# Patient Record
Sex: Male | Born: 1970 | Hispanic: Yes | Marital: Married | Smoking: Current every day smoker
Health system: Southern US, Community
[De-identification: ages and names within clinical notes are randomized; demographics above are authoritative.]

## PROBLEM LIST (undated history)

## (undated) DIAGNOSIS — K2970 Gastritis, unspecified, without bleeding: Secondary | ICD-10-CM

## (undated) DIAGNOSIS — F32A Depression, unspecified: Secondary | ICD-10-CM

## (undated) DIAGNOSIS — B9681 Helicobacter pylori [H. pylori] as the cause of diseases classified elsewhere: Secondary | ICD-10-CM

## (undated) DIAGNOSIS — R443 Hallucinations, unspecified: Secondary | ICD-10-CM

## (undated) DIAGNOSIS — I10 Essential (primary) hypertension: Secondary | ICD-10-CM

## (undated) DIAGNOSIS — F329 Major depressive disorder, single episode, unspecified: Secondary | ICD-10-CM

---

## 2013-09-13 ENCOUNTER — Emergency Department: Payer: Self-pay | Admitting: Emergency Medicine

## 2013-09-13 LAB — COMPREHENSIVE METABOLIC PANEL
Albumin: 3.8 g/dL (ref 3.4–5.0)
Anion Gap: 2 — ABNORMAL LOW (ref 7–16)
Bilirubin,Total: 0.5 mg/dL (ref 0.2–1.0)
Calcium, Total: 9 mg/dL (ref 8.5–10.1)
Chloride: 112 mmol/L — ABNORMAL HIGH (ref 98–107)
Co2: 27 mmol/L (ref 21–32)
Creatinine: 0.9 mg/dL (ref 0.60–1.30)
EGFR (African American): >60
Osmolality: 280 (ref 275–301)
SGOT(AST): 32 U/L (ref 15–37)
SGPT (ALT): 35 U/L (ref 12–78)
Total Protein: 7.2 g/dL (ref 6.4–8.2)

## 2013-09-13 LAB — CBC WITH DIFFERENTIAL/PLATELET
Basophil #: 0.1 x10 3/mm 3 (ref 0.0–0.1)
Basophil %: 0.9 %
Eosinophil #: 0.2 x10 3/mm 3 (ref 0.0–0.7)
Eosinophil %: 2.1 %
HCT: 46.3 % (ref 40.0–52.0)
HGB: 15.2 g/dL (ref 13.0–18.0)
Lymphocyte %: 24 %
Lymphs Abs: 1.8 x10 3/mm 3 (ref 1.0–3.6)
MCH: 30.6 pg (ref 26.0–34.0)
MCHC: 32.9 g/dL (ref 32.0–36.0)
MCV: 93 fL (ref 80–100)
Monocyte #: 0.5 x10 3/mm (ref 0.2–1.0)
Monocyte %: 6.8 %
Neutrophil #: 5 x10 3/mm 3 (ref 1.4–6.5)
Neutrophil %: 66.2 %
Platelet: 210 x10 3/mm 3 (ref 150–440)
RBC: 4.98 x10 6/mm 3 (ref 4.40–5.90)
RDW: 14.3 % (ref 11.5–14.5)
WBC: 7.5 x10 3/mm 3 (ref 3.8–10.6)

## 2013-09-13 LAB — URINALYSIS, COMPLETE
Bacteria: NONE SEEN
Glucose,UR: NEGATIVE mg/dL (ref 0–75)
Ketone: NEGATIVE
Leukocyte Esterase: NEGATIVE
Nitrite: NEGATIVE
Ph: 5 (ref 4.5–8.0)
Protein: NEGATIVE
RBC,UR: 5 /HPF (ref 0–5)
WBC UR: <1 /HPF (ref 0–5)

## 2015-05-11 ENCOUNTER — Emergency Department (HOSPITAL_BASED_OUTPATIENT_CLINIC_OR_DEPARTMENT_OTHER): Payer: Self-pay

## 2015-05-11 ENCOUNTER — Emergency Department (HOSPITAL_BASED_OUTPATIENT_CLINIC_OR_DEPARTMENT_OTHER)
Admission: EM | Admit: 2015-05-11 | Discharge: 2015-05-11 | Disposition: A | Discharge status: Home / Self Care | Payer: Self-pay | Attending: Emergency Medicine | Admitting: Emergency Medicine

## 2015-05-11 ENCOUNTER — Encounter (HOSPITAL_BASED_OUTPATIENT_CLINIC_OR_DEPARTMENT_OTHER): Payer: Self-pay

## 2015-05-11 DIAGNOSIS — E860 Dehydration: Secondary | ICD-10-CM | POA: Insufficient documentation

## 2015-05-11 DIAGNOSIS — R42 Dizziness and giddiness: Secondary | ICD-10-CM | POA: Insufficient documentation

## 2015-05-11 DIAGNOSIS — Z720 Tobacco use: Secondary | ICD-10-CM | POA: Insufficient documentation

## 2015-05-11 DIAGNOSIS — R4020 Unspecified coma: Secondary | ICD-10-CM | POA: Insufficient documentation

## 2015-05-11 DIAGNOSIS — I10 Essential (primary) hypertension: Secondary | ICD-10-CM | POA: Insufficient documentation

## 2015-05-11 DIAGNOSIS — R4189 Other symptoms and signs involving cognitive functions and awareness: Secondary | ICD-10-CM

## 2015-05-11 DIAGNOSIS — Z8659 Personal history of other mental and behavioral disorders: Secondary | ICD-10-CM | POA: Insufficient documentation

## 2015-05-11 HISTORY — DX: Essential (primary) hypertension: I10

## 2015-05-11 HISTORY — DX: Major depressive disorder, single episode, unspecified: F32.9

## 2015-05-11 HISTORY — DX: Depression, unspecified: F32.A

## 2015-05-11 LAB — CBC WITH DIFFERENTIAL/PLATELET
BASOS ABS: 0 10*3/uL (ref 0.0–0.1)
Basophils Relative: 0 % (ref 0–1)
Eosinophils Absolute: 0.2 10*3/uL (ref 0.0–0.7)
Eosinophils Relative: 2 % (ref 0–5)
HEMATOCRIT: 46.5 % (ref 39.0–52.0)
HEMOGLOBIN: 15.8 g/dL (ref 13.0–17.0)
LYMPHS PCT: 24 % (ref 12–46)
Lymphs Abs: 2.1 10*3/uL (ref 0.7–4.0)
MCH: 30.6 pg (ref 26.0–34.0)
MCHC: 34 g/dL (ref 30.0–36.0)
MCV: 90.1 fL (ref 78.0–100.0)
MONO ABS: 0.8 10*3/uL (ref 0.1–1.0)
MONOS PCT: 9 % (ref 3–12)
NEUTROS ABS: 5.6 10*3/uL (ref 1.7–7.7)
Neutrophils Relative %: 65 % (ref 43–77)
Platelets: 233 10*3/uL (ref 150–400)
RBC: 5.16 MIL/uL (ref 4.22–5.81)
RDW: 13.9 % (ref 11.5–15.5)
WBC: 8.7 10*3/uL (ref 4.0–10.5)

## 2015-05-11 LAB — COMPREHENSIVE METABOLIC PANEL
ALK PHOS: 134 U/L — AB (ref 38–126)
ALT: 23 U/L (ref 17–63)
AST: 22 U/L (ref 15–41)
Albumin: 4.6 g/dL (ref 3.5–5.0)
Anion gap: 12 (ref 5–15)
BILIRUBIN TOTAL: 0.6 mg/dL (ref 0.3–1.2)
BUN: 28 mg/dL — AB (ref 6–20)
CALCIUM: 9.6 mg/dL (ref 8.9–10.3)
CO2: 25 mmol/L (ref 22–32)
Chloride: 100 mmol/L — ABNORMAL LOW (ref 101–111)
Creatinine, Ser: 2.23 mg/dL — ABNORMAL HIGH (ref 0.61–1.24)
GFR calc Af Amer: 40 mL/min — ABNORMAL LOW (ref >60)
GFR, EST NON AFRICAN AMERICAN: 34 mL/min — AB (ref >60)
Glucose, Bld: 101 mg/dL — ABNORMAL HIGH (ref 65–99)
POTASSIUM: 4.3 mmol/L (ref 3.5–5.1)
Sodium: 137 mmol/L (ref 135–145)
TOTAL PROTEIN: 7.3 g/dL (ref 6.5–8.1)

## 2015-05-11 LAB — TROPONIN I: Troponin I: <0.03 ng/mL (ref <0.031)

## 2015-05-11 LAB — CBG MONITORING, ED: GLUCOSE-CAPILLARY: 101 mg/dL — AB (ref 65–99)

## 2015-05-11 LAB — I-STAT CG4 LACTIC ACID, ED: Lactic Acid, Venous: 0.92 mmol/L (ref 0.5–2.0)

## 2015-05-11 LAB — CK: CK TOTAL: 253 U/L (ref 49–397)

## 2015-05-11 MED ORDER — SODIUM CHLORIDE 0.9 % IV BOLUS (SEPSIS)
2000.0000 mL | Freq: Once | INTRAVENOUS | Status: AC
  Administered 2015-05-11: 2000 mL via INTRAVENOUS

## 2015-05-11 MED ORDER — SODIUM CHLORIDE 0.9 % IV BOLUS (SEPSIS)
1000.0000 mL | Freq: Once | INTRAVENOUS | Status: AC
  Administered 2015-05-11: 1000 mL via INTRAVENOUS

## 2015-05-11 NOTE — ED Notes (Signed)
Patient transported to CT 

## 2015-05-11 NOTE — ED Notes (Signed)
Hung 1L NS before pt left for radiology

## 2015-05-11 NOTE — ED Notes (Signed)
MD at bedside. 

## 2015-05-11 NOTE — ED Provider Notes (Signed)
CSN: [id number]     Arrival date & time 05/11/15  1806 History  This chart was scribed for Mirian Mo, MD by Lyndel Safe, ED Scribe. This patient was seen in room MH10/MH10 and the patient's care was started 6:17 PM.   Chief Complaint  Patient presents with  . Unresponsive   Patient is a 44 y.o. male presenting with altered mental status. The history is provided by a relative and the spouse. The history is limited by the condition of the patient. No language interpreter was used.  Altered Mental Status Presenting symptoms: partial responsiveness   Severity:  Moderate Most recent episode:  Today Episode history:  Continuous Duration:  20 minutes Timing:  Constant Progression:  Partially resolved Chronicity:  New Context: not a nursing home resident, not a recent illness and not a recent infection   Associated symptoms: no fever     HPI Comments: Miguel Contreras is a 44 y.o. male, with a PMhx of HTN, who presents to the Emergency Department complaining of an episode of semi-unresponsiveness onset 20 minutes PTA. Wife states pt's condition is gradually improving. Per wife pt was working outside in the heat when he called her and stated he felt dizzy and was requesting her to pick him up from work. The pt's boss then called his wife and stated pt was semi-unresponsive laying on the grass. Wife states at the time she picked the pt up from work he was able to respond to her questions. Per nurse, pt was unresponsive to sternal rub on stretcher upon arrival. Pt's wife states pt has been working outside in the extreme heat and has been dehydrated. Wife states pt drinks approximately 4 Monster energy drinks per day and also consumes 5 hour energy shots. Wife denies fevers or recent illness. Pt denies current pain.  Past Medical History  Diagnosis Date  . Hypertension   . Depression    History reviewed. No pertinent past surgical history. No family history on file. Social History   Substance Use Topics  . Smoking status: Current Every Day Smoker  . Smokeless tobacco: None  . Alcohol Use: Yes    Review of Systems  Constitutional: Negative for fever.  Musculoskeletal: Negative for myalgias and arthralgias.  Neurological: Positive for dizziness.  All other systems reviewed and are negative.  Allergies  Review of patient's allergies indicates no known allergies.  Home Medications   Prior to Admission medications   Not on File   BP 152/103 mmHg  Pulse 73  Temp(Src) 97 F (36.1 C) (Oral)  Resp 10  SpO2 100% Physical Exam  Constitutional: He is oriented to person, place, and time. He appears well-developed and well-nourished.  HENT:  Head: Normocephalic and atraumatic.  Eyes: Conjunctivae and EOM are normal.  Neck: Normal range of motion. Neck supple.  Cardiovascular: Normal rate, regular rhythm and normal heart sounds.   Pulmonary/Chest: Effort normal and breath sounds normal. No respiratory distress.  Abdominal: He exhibits no distension. There is no tenderness. There is no rebound and no guarding.  Musculoskeletal: Normal range of motion.  Neurological: He is alert and oriented to person, place, and time.  Skin: Skin is warm and dry.  Vitals reviewed.   ED Course  Procedures  DIAGNOSTIC STUDIES: Oxygen Saturation is 100% on RA, normal by my interpretation.    COORDINATION OF CARE: 6:25 PM Discussed treatment plan which includes to order diagnostic labs and imaging with pt. Pt acknowledges and agrees to plan.   Labs Review Labs Reviewed  COMPREHENSIVE METABOLIC PANEL - Abnormal; Notable for the following:    Chloride 100 (*)    Glucose, Bld 101 (*)    BUN 28 (*)    Creatinine, Ser 2.23 (*)    Alkaline Phosphatase 134 (*)    GFR calc non Af Amer 34 (*)    GFR calc Af Amer 40 (*)    All other components within normal limits  CBG MONITORING, ED - Abnormal; Notable for the following:    Glucose-Capillary 101 (*)    All other components  within normal limits  CBC WITH DIFFERENTIAL/PLATELET  CK  TROPONIN I  URINALYSIS, ROUTINE W REFLEX MICROSCOPIC (NOT AT [facility])  URINE RAPID DRUG SCREEN, HOSP PERFORMED  I-STAT CG4 LACTIC ACID, ED    Imaging Review Dg Chest 2 View  05/11/2015   CLINICAL DATA:  Unresponsive after working outside all day.  EXAM: CHEST  2 VIEW  COMPARISON:  None.  FINDINGS: Lungs are adequately inflated without focal consolidation or effusion. Cardiomediastinal silhouette is within normal. There is minimal degenerative change of the spine.  IMPRESSION: No active cardiopulmonary disease.   Electronically Signed   By: Elberta Fortis M.D.   On: 05/11/2015 19:30   Ct Head Wo Contrast  05/11/2015   CLINICAL DATA:  Difficulty speaking  EXAM: CT HEAD WITHOUT CONTRAST  TECHNIQUE: Contiguous axial images were obtained from the base of the skull through the vertex without intravenous contrast.  COMPARISON:  None.  FINDINGS: No evidence of parenchymal hemorrhage or extra-axial fluid collection. No mass lesion, mass effect, or midline shift.  No CT evidence of acute infarction.  Cerebral volume is within normal limits.  No ventriculomegaly.  Partial opacification of the visualized ethmoid sinuses. The mastoid air cells are unopacified.  No evidence of calvarial fracture.  IMPRESSION: Normal head CT.   Electronically Signed   By: Charline Bills M.D.   On: 05/11/2015 19:38      EKG Interpretation   Date/Time:  Monday May 11 2015 18:16:44 EDT Ventricular Rate:  74 PR Interval:  134 QRS Duration: 96 QT Interval:  384 QTC Calculation: 426 R Axis:   92 Text Interpretation:  Normal sinus rhythm Rightward axis Moderate voltage  criteria for LVH, may be normal variant Borderline ECG No old tracing to  compare Confirmed by Mirian Mo [phone]) on 05/11/2015 6:28:30 PM      MDM   Final diagnoses:  Unresponsive  Dehydration    44 y.o. male with pertinent PMH of HTN, depression presents with altered mental status in  context of working outside in heat for the past 2 days and for water intake. On arrival today the patient was unresponsive for nursing staff, not responding to sternal rub.  On my examination the patient improved however had received approximately 250 mL of fluid.  Not tachycardic, subsequently obtained workup as above.  Workup obtained as above and with AKI.  Discussed this with the family and offered admission which the patient refused. I discussed that the patient's symptoms could be due to primary renal disease and they continued to refuse. Encourage recheck in 2-3 days either here or at an urgent care. The patient voiced understanding, agreed to follow-up. He is vastly improved after receiving approximately 3-1/2 L of fluid.    I have reviewed all laboratory and imaging studies if ordered as above  1. Unresponsive   2. Dehydration           Mirian Mo, MD 05/11/15 2025

## 2015-05-11 NOTE — Discharge Instructions (Signed)
Confusin (Confusion) La confusin es la incapacidad de pensar con la velocidad o la claridad habituales. La confusin puede aparecer rpidamente o puede manifestarse de a poco, con [employer]. La rapidez con la que aparezca depender de la causa. La confusin puede surgir por diversas causas. CAUSAS   Conmocin, lesin o traumatismo en la cabeza.  Convulsiones.  Ictus.  Cristy Hilts.  Tumores cerebrales.  Disminucin de la funcin cerebral relacionada con la edad (demencia).  Estados emocionales agudos, como furia o terror.  Enfermedad mental, en la que la persona pierde la capacidad de Teacher, adult education lo que es real y lo que no lo es (alucinaciones).  Infecciones, como la infeccin de las vas urinarias (IVU).  Efectos txicos del alcohol, las drogas o medicamentos prescriptos.  Deshidratacin y desequilibrio de Administrator cuerpo (electrolitos).  Falta de sueo.  Niveles bajos de azcar en la sangre (diabetes).  Niveles bajos de oxgeno por afecciones como las enfermedades pulmonares crnicas.  Interacciones [employer] u otros efectos secundarios de los medicamentos.  Deficiencias nutricionales, especialmente de niacina, tiamina, vitaminaC o vitaminaB.  Descenso repentino de Environmental education officer (hipotermia).  Cambio en la rutina, por ejemplo, durante un viaje o una hospitalizacin. SIGNOS Y SNTOMAS  A menudo, las personas declaran que, cuando estn confundidas, tienen pensamientos poco claros o inciertos. La confusin tambin puede incluir sentirse desorientado, lo que significa que uno no tiene conciencia de dnde est o de quin es. Tambin es posible no saber la fecha o la hora. Al estar confundido, tambin se puede tener dificultad para prestar atencin, recordar y tomar decisiones. Asimismo, algunas personas actan con agresividad cuando estn confundidas.  DIAGNSTICO  La evaluacin mdica de la confusin puede incluir lo siguiente:  Anlisis de [place] y  [place].  Radiografas.  Estudios del cerebro y el sistema nervioso.  Anlisis de las ondas cerebrales (electroencefalograma o EEG).  Imgenes por Health visitor (IRM) de la cabeza.  Tomografa computarizada (TC) de la cabeza.  Estudios del [employer], en los que el mdico puede hacer diversas preguntas. Algunas de estas preguntas pueden parecer tontas o extraas, pero son muy importantes para ayudar a diagnosticar y tratar la confusin. TRATAMIENTO  Quizs no sea necesaria la hospitalizacin, pero una persona con confusin no debe estar sola. Permanezca con un familiar o un amigo hasta que la confusin haya desaparecido. Evite el alcohol, los analgsicos y los sedantes hasta que se haya recuperado completamente. No conduzca hasta que el mdico lo autorice. INSTRUCCIONES PARA EL CUIDADO EN EL HOGAR  Lo que pueden hacer los familiares y amigos:  Para averiguar si alguien est confundido, pregntele el nombre, la edad y Soil scientist. Si la persona est insegura o responde de forma incorrecta, est confundida.  Presntese siempre, ms all de que la persona confundida lo conozca [employer].  Recurdele con frecuencia dnde est.  Coloque un calendario y un reloj cerca de la persona confundida.  Ayude a la persona con los medicamentos. Puede usar un pastillero o una alarma como recordatorio, o darle cada dosis como se la recetaron.  Hable sobre los acontecimientos actuales y los planes para Games developer.  Trate de que el entorno sea calmo, sin ruidos y sereno.  Asegrese de que la persona asista a las visitas de seguimiento con el mdico. PREVENCIN  Modos de evitar la confusin:  Evite el alcohol.  Consuma una dieta equilibrada.  Duerma lo suficiente.  Tome todos los medicamentos como le indic el mdico.  No se asle. Pase tiempo con  otras personas y haga planes para todos los Hilliard.  Si es diabtico, supervise con cuidado los niveles de Banker. SOLICITE ATENCIN  MDICA DE INMEDIATO SI:   Sufre dolores de cabeza intensos, vomita con frecuencia, tiene convulsiones, desvanecimientos o no puede hablar correctamente.  Aumenta la confusin, siente debilidad, adormecimiento, inquietud o modificaciones en la personalidad.  Tiene prdida de equilibrio o [employer], siente que no coordina o se Designer, industrial/product.  Tiene delirios, alucinaciones o ansiedad intensa.  Su familia considera que debe ser controlado nuevamente. Document Released: 09/12/2005 Document Revised: 01/27/2014 [facility] Patient Information 2015 [place], [place]. This information is not intended to replace advice given to you by your health care provider. Make sure you discuss any questions you have with your health care provider.  Deshidratacin, Adulto (Dehydration, Adult) Se denomina deshidratacin cuando se pierden ms lquidos de los que se ingieren. Los rganos [employer] riones, el cerebro y el corazn no pueden funcionar sin la [place] cantidad de lquidos y Airline pilot. Cualquier prdida de lquidos del organismo puede causar deshidratacin.  CAUSAS  Vmitos.  Diarrea.  Sudoracin excesiva.  Eliminacin excesiva de orina.  Grant Ruts. SNTOMAS Deshidratacin leve  Sed.  Labios resecos.  Sequedad leve de la mucosa bucal. Deshidratacin moderada   La boca est muy seca.  Ojos hundidos.  La piel no vuelve rpidamente a su lugar cuando se suelta luego de pellizcarla ligeramente.  Larose Kells y disminucin de la produccin de [place].  Disminucin de la cantidad de lgrimas.  Dolor de [place]. Deshidratacin grave  La boca est muy seca.  Sed extrema.  Pulso rpido y dbil (ms de 100 por minuto en reposo).  Manos y pies fros.  Prdida de la capacidad para transpirar, independientemente del calor y la temperatura.  Respiracin rpida.  Labios azulados.  Confusin y Health and safety inspector.  Dificultad para despertarse.  Poca produccin de [place].  Falta de  lgrimas. DIAGNSTICO El profesional diagnosticar deshidratacin basndose en los sntomas y el examen fsico. Las pruebas de sangre y [place] ayudarn a Astronomer el diagnstico. La evaluacin diagnstica suele identificar tambin las causas de la deshidratacin. TRATAMIENTO El tratamiento de la deshidratacin leve o moderada generalmente puede hacerse en el hogar mediante el aumento de la cantidad de los lquidos que se beben. Es mejor beber pequeas cantidades de lquidos con mayor frecuencia. Beber demasiado de una sola vez puede hacer que el vmito empeore. Siga las instrucciones para el cuidado en el hogar que se indican a continuacin.  La deshidratacin grave debe tratarse en el hospital en el que probablemente le administren lquidos por va intravenosa (IV) que contienen agua y Customer service manager.  INSTRUCCIONES PARA EL CUIDADO DOMICILIARIO  Pida instrucciones especficas a su mdico con respecto a la rehidratacin.  Debe ingerir gran cantidad de lquido para mantener la orina de tono claro o color amarillo plido.  Beba pequeas cantidades con frecuencia si tiene nuseas y vmitos.  Alimntese como lo hace normalmente.  Evite:  Alimentos o bebidas que Energy manager.  Gaseosas.  Jugos.  Lquidos muy calientes o fros.  Bebidas con cafena.  Alimentos muy grasos.  Alcohol.  Lowella Dell demasiado a la vez.  Postres de gelatina.  Lave bien sus manos para evitar las bacterias o los virus se diseminen.  Slo tome medicamentos de venta libre o recetados para Primary school teacher, las molestias o bajar la fiebre segn las indicaciones de su mdico.  Consulte a su mdico si puede seguir tomando todos sus medicamentos recetados o de venta  libre.  Chauncy Passy puntualmente a las citas de control con el mdico. SOLICITE ATENCIN MDICA SI:  Tiene dolor abdominal y este aumenta o permanece en un rea (se localiza).  Aparece una erupcin, rigidez en el cuello o dolor de  cabeza intensos.  Est irritable, somnoliento o le cuesta despertarse.  Se siente dbil, mareado tiene mucha sed. SOLICITE ATENCIN MDICA DE INMEDIATO SI:  No puede retener lquidos o empeora a pesar del tratamiento.  Tiene episodios frecuentes de vmitos o diarrea.  Observa sangre o una sustancia verde (bilis) en el vmito.  Maxie Better en la materia fecal, o las heces son negras y de aspecto alquitranado.  No ha orinado durante 6 a 8 horas, o slo ha [place] cantidad [place] de [place].  Tiene fiebre.  Se desmaya. ASEGRESE QUE:  Comprende estas instrucciones.  Controlar su enfermedad.  Solicitar ayuda inmediatamente si no mejora o si empeora. Document Released: 09/12/2005 Document Revised: 12/05/2011 [facility] Patient Information 2015 [place], [place]. This information is not intended to replace advice given to you by your health care provider. Make sure you discuss any questions you have with your health care provider.

## 2015-05-11 NOTE — ED Notes (Signed)
Pt brought in by family, put on stretcher, brought back to room 10. Pt not responding to sternal rub. Once in room, pt opening eyes but not really answering questions. Wife reports pt has history of HTN but no meds. Pt has been working outside.

## 2015-05-11 NOTE — ED Notes (Signed)
CT tech informed this RN that patient/family is refusing to have CT head and XR chest performed. MD made aware, MD Littie Deeds in to see the patient.

## 2015-06-14 ENCOUNTER — Encounter (HOSPITAL_COMMUNITY): Payer: Self-pay | Admitting: Nurse Practitioner

## 2015-06-14 ENCOUNTER — Emergency Department (HOSPITAL_COMMUNITY)
Admission: EM | Admit: 2015-06-14 | Discharge: 2015-06-15 | Disposition: A | Discharge status: Home / Self Care | Payer: Self-pay | Attending: Emergency Medicine | Admitting: Emergency Medicine

## 2015-06-14 DIAGNOSIS — R5383 Other fatigue: Secondary | ICD-10-CM | POA: Insufficient documentation

## 2015-06-14 DIAGNOSIS — I10 Essential (primary) hypertension: Secondary | ICD-10-CM | POA: Insufficient documentation

## 2015-06-14 DIAGNOSIS — Z8659 Personal history of other mental and behavioral disorders: Secondary | ICD-10-CM | POA: Insufficient documentation

## 2015-06-14 DIAGNOSIS — Z720 Tobacco use: Secondary | ICD-10-CM | POA: Insufficient documentation

## 2015-06-14 NOTE — ED Notes (Signed)
Bed: [facility] Expected date:  Expected time:  Means of arrival:  Comments: EMS 44 yo male, walking in road-c/o weakness and nausea/Zofran

## 2015-06-14 NOTE — ED Notes (Signed)
[employer] took pt to conference room for confidentiality.

## 2015-06-14 NOTE — ED Notes (Signed)
Pt is refusing any care at this time, states he wants to go to jail for something he did, states "don't think I am crazy, I am not"

## 2015-06-14 NOTE — ED Notes (Signed)
Pt is presented by EMS, report of by standers call stating pt was dizzy and nauseated. States he was recently seen for a heat/dehydration and this feel similar. Also states he wants to talk to [employer] regarding something he did in the past, I plan to notify [employer] off duty to facility pt's request.

## 2015-06-14 NOTE — ED Notes (Signed)
[employer] at bedside now

## 2015-06-14 NOTE — ED Provider Notes (Signed)
CSN: [id number]   Arrival date & time 06/14/15 2206  History  This chart was scribed for Paula Libra, MD by Bethel Born, ED Scribe. This patient was seen in room WHALB/WHALB and the patient's care was started at 11:16 PM.  Chief Complaint  Patient presents with  . Nausea    HPI The history is provided by the patient. No language interpreter was used.   Miguel Contreras is a 44 y.o. male with PMHx of HTN and depression who presents to the Emergency Department complaining of an episode of light headedness with onset earlier today. Associated symptoms include nausea. He describes his symptoms as feeling like a recent episode of dehydration for which she was treated in the ED. His symptoms have now resolved and he is requesting to be discharged. He denies SI or HI.  Past Medical History  Diagnosis Date  . Hypertension   . Depression     History reviewed. No pertinent past surgical history.  History reviewed. No pertinent family history.  Social History  Substance Use Topics  . Smoking status: Current Every Day Smoker  . Smokeless tobacco: None  . Alcohol Use: Yes     Review of Systems 10 Systems reviewed and all are negative for acute change except as noted in the HPI. Home Medications   Prior to Admission medications   Not on File    Allergies  Review of patient's allergies indicates no known allergies.  Triage Vitals: BP 134/89 mmHg  Pulse 92  Temp(Src) 97.8 F (36.6 C) (Oral)  Resp 16  SpO2 98%  Physical Exam General: Well-developed, well-nourished male in no acute distress; appearance consistent with age of record HENT: normocephalic; atraumatic Eyes: pupils equal, round and reactive to light; extraocular muscles intact Neck: supple Heart: regular rate and rhythm; no murmurs, rubs or gallops Lungs: clear to auscultation bilaterally Abdomen: soft; nondistended; nontender; no masses or hepatosplenomegaly; bowel sounds present Extremities: No deformity; full  range of motion; pulses normal, Ganglion cyst of radial side of right wrist Neurologic: Awake, alert and oriented; motor function intact in all extremities and symmetric; no facial droop Skin: Warm and dry Psychiatric: Flat affect  ED Course  Procedures   DIAGNOSTIC STUDIES: Oxygen Saturation is 98% on RA, normal by my interpretation.    COORDINATION OF CARE: 11:19 PM Discussed treatment plan with pt at bedside and pt agreed to plan.    MDM   Final diagnoses:  Other fatigue   I personally performed the services described in this documentation, which was scribed in my presence. The recorded information has been reviewed and is accurate.   Paula Libra, MD 06/15/15 [phone]

## 2015-06-30 ENCOUNTER — Emergency Department (HOSPITAL_COMMUNITY)
Admission: EM | Admit: 2015-06-30 | Discharge: 2015-06-30 | Discharge status: Against Medical Advice | Payer: Self-pay | Attending: Emergency Medicine | Admitting: Emergency Medicine

## 2015-06-30 DIAGNOSIS — Z5321 Procedure and treatment not carried out due to patient leaving prior to being seen by health care provider: Secondary | ICD-10-CM | POA: Insufficient documentation

## 2015-06-30 DIAGNOSIS — Z720 Tobacco use: Secondary | ICD-10-CM | POA: Insufficient documentation

## 2015-06-30 DIAGNOSIS — I10 Essential (primary) hypertension: Secondary | ICD-10-CM | POA: Insufficient documentation

## 2015-06-30 NOTE — ED Notes (Signed)
Pt requesting to leave; pt encouraged to stay for MD exam; pt insists that he feels better and would like to leave; IV placed by EMS removed; pt signed AMA form and was walked to lobby where he met his family

## 2015-07-26 ENCOUNTER — Encounter (HOSPITAL_BASED_OUTPATIENT_CLINIC_OR_DEPARTMENT_OTHER): Payer: Self-pay | Admitting: *Deleted

## 2015-07-26 ENCOUNTER — Emergency Department (HOSPITAL_BASED_OUTPATIENT_CLINIC_OR_DEPARTMENT_OTHER)
Admission: EM | Admit: 2015-07-26 | Discharge: 2015-07-26 | Disposition: A | Discharge status: Home / Self Care | Payer: Self-pay | Attending: Emergency Medicine | Admitting: Emergency Medicine

## 2015-07-26 DIAGNOSIS — F17200 Nicotine dependence, unspecified, uncomplicated: Secondary | ICD-10-CM | POA: Insufficient documentation

## 2015-07-26 DIAGNOSIS — Z760 Encounter for issue of repeat prescription: Secondary | ICD-10-CM | POA: Insufficient documentation

## 2015-07-26 DIAGNOSIS — F329 Major depressive disorder, single episode, unspecified: Secondary | ICD-10-CM | POA: Insufficient documentation

## 2015-07-26 DIAGNOSIS — I10 Essential (primary) hypertension: Secondary | ICD-10-CM | POA: Insufficient documentation

## 2015-07-26 DIAGNOSIS — F32A Depression, unspecified: Secondary | ICD-10-CM

## 2015-07-26 MED ORDER — AMITRIPTYLINE HCL 25 MG PO TABS: 25.0000 mg | ORAL_TABLET | Freq: Every day | ORAL | Status: AC

## 2015-07-26 NOTE — ED Notes (Signed)
Pt out of medication, needs refill- can't see doctor for 2 weeks

## 2015-07-26 NOTE — Discharge Instructions (Signed)
Trastorno depresivo mayor  (Major Depressive Disorder)  El trastorno depresivo mayor es una enfermedad mental. También se llamado depresión clínica o depresión unipolar. Produce sentimientos de tristeza, desesperanza o desamparo. Algunas personas con trastorno depresivo mayor no se sienten particularmente tristes, pero pierden el interés en hacer las cosas que solían disfrutar (anhedonia). También puede causar síntomas físicos. Interfiere en el trabajo, la escuela, las relaciones y otras actividades diarias normales. Puede variar en gravedad, pero es más duradera y más grave que la tristeza que todos sentimos de vez en cuando en nuestras vidas.   Muchas veces es desencadenada por sucesos estresantes o cambios importantes en la vida. Algunos ejemplos de estos factores desencadenantes son el divorcio, la pérdida del trabajo o el hogar, una mudanza, y la muerte de un familiar o amigo cercano. A veces aparece sin ninguna razón evidente. Las personas que tienen familiares con depresión mayor o con trastorno bipolar tienen más riesgo de desarrollar depresión mayor con o sin factores de estrés. Puede ocurrir a cualquier edad. Puede ocurrir sólo una vez en su vida(episodio único de trastorno depresivo mayor). Puede ocurrir varias veces (trastorno depresivo mayor recurrente).   SÍNTOMAS   Las personas con trastorno depresivo mayor presentan anhedonia o estado de ánimo deprimido casi todos los días durante al menos 2 semanas o más. Los síntomas son:   · Sensación de tristeza (melancolía) o vacío.  · Sentimientos de desesperanza o desamparo.  · Lagrimeo o episodios de llanto ( es observado por los demás).  · Irritabilidad (en niños y adolescentes).  Además del estado de ánimo deprimido o la anhedonia o ambos, estos enfermos tienen al menos cuatro de los siguientes síntomas :   · Dificultad para dormir o dormir demasiado.    · Cambio significativo (aumento o disminución) en el apetito o el peso.    · Falta de energía o  motivación.  · Sentimientos de culpa o desvalorización.    · Dificultad para concentrarse, recordar o tomar decisiones.  · Movimientos inusualmente lentos (retardo psicomotor retardation) o inquietud (según lo observado por los demás).    · Deseos recurrentes de muerte, pensamientos recurrentes de autoagresión (suicidio) o intento de suicidio.  Las personas con trastorno depresivo mayor suelen tener pensamientos persistentes negativos acerca de sí mismos, de otras personas y del mundo. Las personas con trastorno depresivo mayor grave pueden experimentar creencias o percepciones distorsionadas sobre el mundo (delirios psicóticos). También pueden ver u oír cosas que no son reales(alucinaciones psicóticas).   DIAGNÓSTICO   El diagnóstico se realiza mediante una evaluación hecha por el médico. El médico le preguntará acerca de los aspectos de su vida cotidiana, como el estado de ánimo, el sueño y el apetito, para ver si usted tiene los síntomas de depresión mayor. Le hará preguntas sobre su historial médico y el consumo de alcohol o drogas, incluyendo medicamentos recetados. También le hará un examen físico y le indicará análisis de sangre. Esto se debe a que ciertas enfermedades y el uso de determinadas sustancias pueden causar síntomas similares a la depresión (depresión secundaria). Su médico también podría derivarlo a un especialista en salud mental para una evaluación y tratamiento.   TRATAMIENTO   Es importante reconocer los síntomas y buscar tratamiento. Los siguientes tratamientos pueden indicarse:     · Medicamentos - Generalmente se recetan antidepresivos. Los antidepresivos se piensa que corrigen los desequilibrios químicos en el cerebro que se asocian comúnmente a la depresión mayor. Se pueden agregar otros tipos de medicamentos si los síntomas no responden a los antidepresivos solos o si   hay ideas delirantes o alucinaciones psicóticas.  · Psicoterapia - ciertos tipos de psicoterapia pueden ser útiles en el  tratamiento del trastorno de la depresión mayor, proporcionando apoyo, educación y orientación. Ciertos tipos de psicoterapia también pueden ayudar a superar los pensamientos negativos (terapia cognitivo conductual) y a problemas de relación que desencadena la depresión mayor (terapia interpersonal).  Un especialista en salud mental puede ayudarlo a determinar qué tratamiento es el mejor para usted. La mayoría de los pacientes mejoran con una combinación de medicación y psicoterapia. Los tratamientos que implican la estimulación eléctrica del cerebro pueden ser utilizados en situaciones con síntomas muy graves o cuando los medicamentos y la psicoterapia no funcionan después de un tiempo. Estos tratamientos incluyen terapia electroconvulsiva, estimulación magnética transcraneal y estimulación del nervio vago.      Esta información no tiene como fin reemplazar el consejo del médico. Asegúrese de hacerle al médico cualquier pregunta que tenga.     Document Released: 01/07/2013 Document Revised: 10/03/2014  Elsevier Interactive Patient Education ©2016 [employer]

## 2015-07-26 NOTE — ED Notes (Signed)
Rx x 1 given

## 2015-07-26 NOTE — ED Provider Notes (Signed)
CSN: [id number]     Arrival date & time 07/26/15  1139 History   First MD Initiated Contact with Patient 07/26/15 1146     Chief Complaint  Patient presents with  . Medication Refill     (Consider location/radiation/quality/duration/timing/severity/associated sxs/prior Treatment) HPI Comments: Patient presents to the ER asking for refill of his amitriptyline. Patient's significant other dropped some of the pills down the sink when she was placing them in his pill dispenser. His primary prescriber would not refill them. He does have an appointment in 2 weeks where he can get refills, needs a 2 week supply. Patient has had some depression, but is not homicidal or suicidal.   Past Medical History  Diagnosis Date  . Hypertension   . Depression    History reviewed. No pertinent past surgical history. No family history on file. Social History  Substance Use Topics  . Smoking status: Current Every Day Smoker  . Smokeless tobacco: Never Used  . Alcohol Use: No    Review of Systems  Psychiatric/Behavioral: Positive for dysphoric mood.  All other systems reviewed and are negative.     Allergies  Review of patient's allergies indicates no known allergies.  Home Medications   Prior to Admission medications   Medication Sig Start Date End Date Taking? Authorizing Provider  amitriptyline (ELAVIL) 25 MG tablet Take 25 mg by mouth at bedtime.   Yes Historical Provider, MD   BP 137/91 mmHg  Pulse 91  Temp(Src) 98.7 F (37.1 C) (Oral)  Resp 18  Ht  (1.753 m)  Wt 157 lb (71.215 kg)  BMI 23.17 kg/m2  SpO2 100% Physical Exam  Constitutional: He is oriented to person, place, and time. He appears well-developed and well-nourished. No distress.  HENT:  Head: Normocephalic and atraumatic.  Right Ear: Hearing normal.  Left Ear: Hearing normal.  Nose: Nose normal.  Mouth/Throat: Oropharynx is clear and moist and mucous membranes are normal.  Eyes: Conjunctivae and EOM are  normal. Pupils are equal, round, and reactive to light.  Neck: Normal range of motion. Neck supple.  Cardiovascular: Regular rhythm, S1 normal and S2 normal.  Exam reveals no gallop and no friction rub.   No murmur heard. Pulmonary/Chest: Effort normal and breath sounds normal. No respiratory distress. He exhibits no tenderness.  Abdominal: Soft. Normal appearance and bowel sounds are normal. There is no hepatosplenomegaly. There is no tenderness. There is no rebound, no guarding, no tenderness at McBurney's point and negative Murphy's sign. No hernia.  Musculoskeletal: Normal range of motion.  Neurological: He is alert and oriented to person, place, and time. He has normal strength. No cranial nerve deficit or sensory deficit. Coordination normal. GCS eye subscore is 4. GCS verbal subscore is 5. GCS motor subscore is 6.  Skin: Skin is warm, dry and intact. No rash noted. No cyanosis.  Psychiatric: He has a normal mood and affect. His speech is normal and behavior is normal. Thought content normal.  Nursing note and vitals reviewed.   ED Course  Procedures (including critical care time) Labs Review Labs Reviewed - No data to display  Imaging Review No results found. I have personally reviewed and evaluated these images and lab results as part of my medical decision-making.   EKG Interpretation None      MDM   Final diagnoses:  None   medication refill  Requesting 2 week supply of Elavil. Part of his supply was accidentally destroyed. Not homicidal or suicidal. Has follow-up in 2 weeks.  Gilda Creasehristopher J Taylen Osorto, MD 07/26/15 1154

## 2015-08-08 ENCOUNTER — Encounter (HOSPITAL_COMMUNITY): Payer: Self-pay

## 2015-08-08 ENCOUNTER — Emergency Department (HOSPITAL_COMMUNITY)
Admission: EM | Admit: 2015-08-08 | Discharge: 2015-08-08 | Discharge status: Against Medical Advice | Payer: Self-pay | Attending: Emergency Medicine | Admitting: Emergency Medicine

## 2015-08-08 DIAGNOSIS — F17200 Nicotine dependence, unspecified, uncomplicated: Secondary | ICD-10-CM | POA: Insufficient documentation

## 2015-08-08 DIAGNOSIS — I10 Essential (primary) hypertension: Secondary | ICD-10-CM | POA: Insufficient documentation

## 2015-08-08 DIAGNOSIS — F329 Major depressive disorder, single episode, unspecified: Secondary | ICD-10-CM | POA: Insufficient documentation

## 2015-08-08 NOTE — ED Notes (Signed)
I have just been informed by my tech., Jamesetta OrleansEricka that pt. Has just left with his significant other.

## 2015-08-08 NOTE — ED Notes (Signed)
His significant other (pt. Is quiet and pensive) tells us pt. "is real depressed and has mood swings; and he won't take his amitriptyline for me, but he will take ti for you guys".  Denies  s.i./h.i.

## 2015-08-08 NOTE — ED Notes (Signed)
Pt and daughter observed walking out of triage to exit hospital. Pt and wife asked if/why leaving, per wife "he said he's ready/wanting to take his Amitriptyline, and if he says he wants to take I got to give it to him right away. If he refuses again we'll be back." Pt/wife asked if nurse notified of desire to leave AMA, pt wife responds "no, I did not see him, but please let him know, and if he/pt will not take meds we will be back." Pt observed getting in car w/wife/child, drove out of parking lot. Tim/Tequita RN advised of conversation/observation. ENM

## 2015-08-09 ENCOUNTER — Encounter (HOSPITAL_BASED_OUTPATIENT_CLINIC_OR_DEPARTMENT_OTHER): Payer: Self-pay | Admitting: Emergency Medicine

## 2015-08-09 ENCOUNTER — Emergency Department (HOSPITAL_BASED_OUTPATIENT_CLINIC_OR_DEPARTMENT_OTHER)
Admission: EM | Admit: 2015-08-09 | Discharge: 2015-08-09 | Disposition: A | Discharge status: Home / Self Care | Payer: Self-pay | Attending: Emergency Medicine | Admitting: Emergency Medicine

## 2015-08-09 DIAGNOSIS — F329 Major depressive disorder, single episode, unspecified: Secondary | ICD-10-CM | POA: Insufficient documentation

## 2015-08-09 DIAGNOSIS — R451 Restlessness and agitation: Secondary | ICD-10-CM | POA: Insufficient documentation

## 2015-08-09 DIAGNOSIS — Z720 Tobacco use: Secondary | ICD-10-CM | POA: Insufficient documentation

## 2015-08-09 DIAGNOSIS — I10 Essential (primary) hypertension: Secondary | ICD-10-CM | POA: Insufficient documentation

## 2015-08-09 DIAGNOSIS — Z79899 Other long term (current) drug therapy: Secondary | ICD-10-CM | POA: Insufficient documentation

## 2015-08-09 DIAGNOSIS — F1510 Other stimulant abuse, uncomplicated: Secondary | ICD-10-CM | POA: Insufficient documentation

## 2015-08-09 LAB — BASIC METABOLIC PANEL
ANION GAP: 7 (ref 5–15)
BUN: 11 mg/dL (ref 6–20)
CALCIUM: 9.4 mg/dL (ref 8.9–10.3)
CO2: 24 mmol/L (ref 22–32)
Chloride: 106 mmol/L (ref 101–111)
Creatinine, Ser: 0.73 mg/dL (ref 0.61–1.24)
Glucose, Bld: 103 mg/dL — ABNORMAL HIGH (ref 65–99)
Potassium: 3.8 mmol/L (ref 3.5–5.1)
Sodium: 137 mmol/L (ref 135–145)

## 2015-08-09 LAB — CBC WITH DIFFERENTIAL/PLATELET
BASOS ABS: 0 10*3/uL (ref 0.0–0.1)
BASOS PCT: 0 %
Eosinophils Absolute: 0.3 10*3/uL (ref 0.0–0.7)
Eosinophils Relative: 4 %
HEMATOCRIT: 47.8 % (ref 39.0–52.0)
HEMOGLOBIN: 16.3 g/dL (ref 13.0–17.0)
Lymphocytes Relative: 29 %
Lymphs Abs: 2.6 10*3/uL (ref 0.7–4.0)
MCH: 31 pg (ref 26.0–34.0)
MCHC: 34.1 g/dL (ref 30.0–36.0)
MCV: 91 fL (ref 78.0–100.0)
Monocytes Absolute: 0.8 10*3/uL (ref 0.1–1.0)
Monocytes Relative: 8 %
NEUTROS ABS: 5.3 10*3/uL (ref 1.7–7.7)
NEUTROS PCT: 59 %
PLATELETS: 227 10*3/uL (ref 150–400)
RBC: 5.25 MIL/uL (ref 4.22–5.81)
RDW: 13.6 % (ref 11.5–15.5)
WBC: 9 10*3/uL (ref 4.0–10.5)

## 2015-08-09 LAB — RAPID URINE DRUG SCREEN, HOSP PERFORMED
Amphetamines: POSITIVE — AB
Barbiturates: NOT DETECTED
Benzodiazepines: NOT DETECTED
Cocaine: NOT DETECTED
Opiates: NOT DETECTED
Tetrahydrocannabinol: NOT DETECTED

## 2015-08-09 LAB — ETHANOL

## 2015-08-09 NOTE — ED Provider Notes (Signed)
CSN: [id number]     Arrival date & time 08/09/15  0502 History   First MD Initiated Contact with Patient 08/09/15 0522     Chief Complaint  Patient presents with  . Agitation     (Consider location/radiation/quality/duration/timing/severity/associated sxs/prior Treatment) HPI  This is a 44 year old male with a history of depression currently on amitriptyline. It is noted that he was seen in the emergency department on October 30 requesting a 2 week refill of his amitriptyline. He checked into the [facility] ED yesterday with a chief complaint of depression but eloped before being evaluated.  He is brought in by his wife who states that he had several boils about 5 months ago. Those have all resolved and he does not have any current skin lesions. She states that since that time he has had an altered mental status with mood swings, periods of depression and agitation. She states she had "an up if any this morning" that these 5 months of altered mental status were due to a staph infection. She states "I refuse to believe he has a middle illness", even though he has been evaluated at [facility] and his lunch and is on amitriptyline for depression.   The patient himself does not wish to be here and denies acute complaint of the lower mild posterior headache. He states he has these headaches frequently following an injury 15 years ago. He denies HI or SI.   Past Medical History  Diagnosis Date  . Hypertension   . Depression    History reviewed. No pertinent past surgical history. No family history on file. Social History  Substance Use Topics  . Smoking status: Current Every Day Smoker  . Smokeless tobacco: Never Used  . Alcohol Use: No    Review of Systems  All other systems reviewed and are negative.   Allergies  Review of patient's allergies indicates no known allergies.  Home Medications   Prior to Admission medications   Medication Sig Start Date End Date Taking? Authorizing  Provider  amitriptyline (ELAVIL) 25 MG tablet Take 1 tablet (25 mg total) by mouth at bedtime. 07/26/15   Gilda Crease, MD   BP 153/110 mmHg  Pulse 102  Temp(Src) 98.3 F (36.8 C) (Oral)  Resp 18  Ht 5' 9.5" (1.765 m)  Wt 165 lb (74.844 kg)  BMI 24.03 kg/m2  SpO2 100%   Physical Exam  General: Well-developed, well-nourished male in no acute distress; appearance consistent with age of record HENT: normocephalic; atraumatic; pharynx normal Eyes: pupils equal, round and reactive to light; extraocular muscles intact Neck: supple Heart: regular rate and rhythm; no murmurs, rubs or gallops Lungs: clear to auscultation bilaterally Abdomen: soft; nondistended; nontender; bowel sounds present Extremities: No deformity; full range of motion Neurologic: Awake, alert and oriented; motor function intact in all extremities and symmetric; no facial droop Skin: Warm and dry Psychiatric: Flat affect; mildly agitated and fidgety but cooperative    ED Course  Procedures (including critical care time)   MDM  Nursing notes and vitals signs, including pulse oximetry, reviewed.  Summary of this visit's results, reviewed by myself:  Labs:  Results for orders placed or performed during the hospital encounter of 08/09/15 (from the past 24 hour(s))  Basic metabolic panel     Status: Abnormal   Collection Time: 08/09/15  5:40 AM  Result Value Ref Range   Sodium 137 135 - 145 mmol/L   Potassium 3.8 3.5 - 5.1 mmol/L   Chloride 106 101 -  111 mmol/L   CO2 24 22 - 32 mmol/L   Glucose, Bld 103 (H) 65 - 99 mg/dL   BUN 11 6 - 20 mg/dL   Creatinine, Ser 0.450.73 0.61 - 1.24 mg/dL   Calcium 9.4 8.9 - 40.910.3 mg/dL   GFR calc non Af Amer >60 >60 mL/min   GFR calc Af Amer >60 >60 mL/min   Anion gap 7 5 - 15  CBC with Differential/Platelet     Status: None   Collection Time: 08/09/15  5:40 AM  Result Value Ref Range   WBC 9.0 4.0 - 10.5 K/uL   RBC 5.25 4.22 - 5.81 MIL/uL   Hemoglobin 16.3 13.0 -  17.0 g/dL   HCT 81.147.8 91.439.0 - 78.252.0 %   MCV 91.0 78.0 - 100.0 fL   MCH 31.0 26.0 - 34.0 pg   MCHC 34.1 30.0 - 36.0 g/dL   RDW 95.613.6 21.311.5 - 08.615.5 %   Platelets 227 150 - 400 K/uL   Neutrophils Relative % 59 %   Neutro Abs 5.3 1.7 - 7.7 K/uL   Lymphocytes Relative 29 %   Lymphs Abs 2.6 0.7 - 4.0 K/uL   Monocytes Relative 8 %   Monocytes Absolute 0.8 0.1 - 1.0 K/uL   Eosinophils Relative 4 %   Eosinophils Absolute 0.3 0.0 - 0.7 K/uL   Basophils Relative 0 %   Basophils Absolute 0.0 0.0 - 0.1 K/uL  Ethanol     Status: None   Collection Time: 08/09/15  5:40 AM  Result Value Ref Range   Alcohol, Ethyl (B) <5 <5 mg/dL  Urine rapid drug screen (hosp performed)     Status: Abnormal   Collection Time: 08/09/15  5:45 AM  Result Value Ref Range   Opiates NONE DETECTED NONE DETECTED   Cocaine NONE DETECTED NONE DETECTED   Benzodiazepines NONE DETECTED NONE DETECTED   Amphetamines POSITIVE (A) NONE DETECTED   Tetrahydrocannabinol NONE DETECTED NONE DETECTED   Barbiturates NONE DETECTED NONE DETECTED   6:06 AM The patient's wife states that in addition to Elavil he has been on Adderall as prescribed by [facility]. He has an appointment with [facility] the day after tomorrow. The patient does not show any evidence of being a harm to himself or others at this time.        Paula LibraJohn Zebulan Hinshaw, MD 08/09/15 [phone]

## 2015-08-09 NOTE — ED Notes (Signed)
pts wife states she thinks he may have a staph infection due to agitation. Patient states he does feel agitated some, but that he feels his issue is an old injury to his back/neck/head. Patient refused to have vitals checked, and he and his wife refused to discuss the matter further until the MD in present.

## 2015-08-12 ENCOUNTER — Encounter (HOSPITAL_BASED_OUTPATIENT_CLINIC_OR_DEPARTMENT_OTHER): Payer: Self-pay | Admitting: Emergency Medicine

## 2015-08-12 ENCOUNTER — Encounter (HOSPITAL_COMMUNITY): Payer: Self-pay | Admitting: Emergency Medicine

## 2015-08-12 ENCOUNTER — Emergency Department (HOSPITAL_COMMUNITY)
Admission: EM | Admit: 2015-08-12 | Discharge: 2015-08-12 | Discharge status: Against Medical Advice | Payer: Self-pay | Attending: Emergency Medicine | Admitting: Emergency Medicine

## 2015-08-12 ENCOUNTER — Emergency Department (HOSPITAL_BASED_OUTPATIENT_CLINIC_OR_DEPARTMENT_OTHER)
Admission: EM | Admit: 2015-08-12 | Discharge: 2015-08-12 | Discharge status: Against Medical Advice | Payer: Self-pay | Attending: Emergency Medicine | Admitting: Emergency Medicine

## 2015-08-12 DIAGNOSIS — F17200 Nicotine dependence, unspecified, uncomplicated: Secondary | ICD-10-CM | POA: Insufficient documentation

## 2015-08-12 DIAGNOSIS — Z79899 Other long term (current) drug therapy: Secondary | ICD-10-CM | POA: Insufficient documentation

## 2015-08-12 DIAGNOSIS — I10 Essential (primary) hypertension: Secondary | ICD-10-CM | POA: Insufficient documentation

## 2015-08-12 DIAGNOSIS — R42 Dizziness and giddiness: Secondary | ICD-10-CM | POA: Insufficient documentation

## 2015-08-12 DIAGNOSIS — F329 Major depressive disorder, single episode, unspecified: Secondary | ICD-10-CM | POA: Insufficient documentation

## 2015-08-12 DIAGNOSIS — Z9189 Other specified personal risk factors, not elsewhere classified: Secondary | ICD-10-CM

## 2015-08-12 DIAGNOSIS — Z5321 Procedure and treatment not carried out due to patient leaving prior to being seen by health care provider: Secondary | ICD-10-CM | POA: Insufficient documentation

## 2015-08-12 LAB — CBC
HEMATOCRIT: 42.4 % (ref 39.0–52.0)
Hemoglobin: 14.2 g/dL (ref 13.0–17.0)
MCH: 31 pg (ref 26.0–34.0)
MCHC: 33.5 g/dL (ref 30.0–36.0)
MCV: 92.6 fL (ref 78.0–100.0)
PLATELETS: 189 10*3/uL (ref 150–400)
RBC: 4.58 MIL/uL (ref 4.22–5.81)
RDW: 13.6 % (ref 11.5–15.5)
WBC: 6.1 10*3/uL (ref 4.0–10.5)

## 2015-08-12 LAB — BASIC METABOLIC PANEL
Anion gap: 6 (ref 5–15)
BUN: 10 mg/dL (ref 6–20)
CALCIUM: 8.7 mg/dL — AB (ref 8.9–10.3)
CO2: 24 mmol/L (ref 22–32)
CREATININE: 0.84 mg/dL (ref 0.61–1.24)
Chloride: 109 mmol/L (ref 101–111)
GFR calc Af Amer: >60 mL/min (ref >60)
GLUCOSE: 109 mg/dL — AB (ref 65–99)
POTASSIUM: 4.1 mmol/L (ref 3.5–5.1)
Sodium: 139 mmol/L (ref 135–145)

## 2015-08-12 LAB — I-STAT CG4 LACTIC ACID, ED: Lactic Acid, Venous: 1.48 mmol/L (ref 0.5–2.0)

## 2015-08-12 MED ORDER — SODIUM CHLORIDE 0.9 % IV BOLUS (SEPSIS)
1000.0000 mL | Freq: Once | INTRAVENOUS | Status: AC
  Administered 2015-08-12: 1000 mL via INTRAVENOUS

## 2015-08-12 NOTE — ED Notes (Signed)
Multiple complaints, sts he is sick, dizzy and doesn't fell well, pt's wife sts he is dehydrated and very angry, pt c/o neck pain - no trauma, is alert, ambulatory and in NAD

## 2015-08-12 NOTE — ED Notes (Signed)
Pt's wife thinks pt is septic and is having mental problems from infection,

## 2015-08-12 NOTE — ED Provider Notes (Signed)
CSN: [id number]     Arrival date & time 08/12/15  1043 History   First MD Initiated Contact with Patient 08/12/15 1122     Chief Complaint  Patient presents with  . Dizziness   HPI  Miguel Contreras is a 44 year old male with PMHx of HTN and depression presenting with multiple complaints. Of note, patient's wife is the one who brought him in complaining of his dizziness, dehydration and "I think he has sepsis ". Patient denies any complaints at this time. Patient's wife states that he has been complaining of dizziness over the past few days. She denies that he has fallen. He states that he has not been dizzy. Patient's wife is also concerned that he has sepsis from abscesses that he had 5 months ago. She states that she went to his primary care doctor's to get checked for sepsis but has not called back for the results. Patient has a history of depression but denies current depressive mood, SI, HI or AVH. Patient repeatedly stating he has no complaints.  Past Medical History  Diagnosis Date  . Hypertension   . Depression    History reviewed. No pertinent past surgical history. No family history on file. Social History  Substance Use Topics  . Smoking status: Current Every Day Smoker  . Smokeless tobacco: Never Used  . Alcohol Use: No    Review of Systems  Constitutional: Negative for fever and chills.  HENT: Negative for congestion, rhinorrhea and sore throat.   Respiratory: Negative for shortness of breath.   Cardiovascular: Negative for chest pain.  Gastrointestinal: Negative for nausea, vomiting and abdominal pain.  Genitourinary: Negative for dysuria and flank pain.  Musculoskeletal: Negative for myalgias and arthralgias.  Skin: Negative for rash.  Neurological: Negative for dizziness, syncope and headaches.  Psychiatric/Behavioral: Negative for suicidal ideas, hallucinations and dysphoric mood.  All other systems reviewed and are negative.     Allergies  Review of patient's  allergies indicates no known allergies.  Home Medications   Prior to Admission medications   Medication Sig Start Date End Date Taking? Authorizing Provider  amitriptyline (ELAVIL) 25 MG tablet Take 1 tablet (25 mg total) by mouth at bedtime. 07/26/15  Yes Gilda Creasehristopher J Pollina, MD  amphetamine-dextroamphetamine (ADDERALL) 10 MG tablet Take 10 mg by mouth daily with breakfast.   Yes Historical Provider, MD   BP 159/106 mmHg  Pulse 76  Temp(Src) 98.6 F (37 C) (Oral)  Resp 16  Ht 5\' 10"  (1.778 m)  Wt 165 lb (74.844 kg)  BMI 23.68 kg/m2  SpO2 100% Physical Exam  Constitutional: He is oriented to person, place, and time. He appears well-developed and well-nourished. No distress.  HENT:  Head: Normocephalic and atraumatic.  Mouth/Throat: Oropharynx is clear and moist.  Eyes: Conjunctivae and EOM are normal. Pupils are equal, round, and reactive to light. Right eye exhibits no discharge. Left eye exhibits no discharge. No scleral icterus.  Neck: Normal range of motion. Neck supple.  Cardiovascular: Normal rate, regular rhythm, normal heart sounds and intact distal pulses.   No peripheral edema  Pulmonary/Chest: Effort normal and breath sounds normal. No respiratory distress. He has no wheezes. He has no rales.  Abdominal: Soft. He exhibits no distension. There is no tenderness.  Musculoskeletal: Normal range of motion. He exhibits no edema or tenderness.  Moves all extremities spontaneously and walks with a steady gait  Lymphadenopathy:    He has no cervical adenopathy.  Neurological: He is alert and oriented to person,  place, and time. No cranial nerve deficit. Coordination normal.  Cranial nerves III through XII tested and intact. 5 out of 5 strength in all major muscle groups. Sensation to light touch intact.  Skin: Skin is warm and dry. No rash noted.  Psychiatric: He has a normal mood and affect. His behavior is normal.  Nursing note and vitals reviewed.   ED Course   Procedures (including critical care time) Labs Review Labs Reviewed  BASIC METABOLIC PANEL - Abnormal; Notable for the following:    Glucose, Bld 109 (*)    Calcium 8.7 (*)    All other components within normal limits  CBC  URINALYSIS, ROUTINE W REFLEX MICROSCOPIC (NOT AT [facility])  I-STAT CG4 LACTIC ACID, ED  I-STAT CG4 LACTIC ACID, ED    Imaging Review No results found. I have personally reviewed and evaluated these images and lab results as part of my medical decision-making.   EKG Interpretation   Date/Time:  Wednesday August 12 2015 10:54:12 EST Ventricular Rate:  94 PR Interval:  138 QRS Duration: 98 QT Interval:  344 QTC Calculation: 430 R Axis:   87 Text Interpretation:  Sinus rhythm Consider left atrial enlargement No  significant change since last tracing Confirmed by Anitra Lauth  MD, Alphonzo Lemmings  [phone]) on 08/12/2015 11:54:02 AM      MDM   Final diagnoses:  Dizziness    Patient presenting with multiple nonspecific complaints. The interaction in the room between wife and patient is very unusual. She reports multiple complaints while he denies. The wife states that she is concerned he has sepsis due to an abscess he had 5 months ago. She is requesting we test for this. She is also convinced that he is dehydrated but cannot explain why she thinks this. Repeatedly asked patient if he has any complaints at this time and he states no. Vital signs stable. Benign exam. Blood work reassuring. EKG without significant changes. Patient given 1 L fluid bolus. Nurse informed me that patient had pulled his IV, thanked the nurse for his care and left the emergency department. Patient had left before I had time to reassess. Patient is an AMA discharge.    Alveta Heimlich, PA-C 08/12/15 1555  Gwyneth Sprout, MD 08/13/15 [phone]

## 2015-08-12 NOTE — ED Notes (Signed)
Pt's wife is adement that patient's depression and behavior issues are due to "sepsis" from a staph infection 5 months ago, attempted to explain the process of sepsis to patient and wife. However she was not willing to listen to patho of sepsis and other causes of mental issues. Wife feels that there is no way his mental issues are from anything other than sepsis. Pt denies HI/SI attempts. Pt states that he does not want to be here and wanted to go home.

## 2015-08-12 NOTE — ED Notes (Signed)
Pt's wife sts pt has MRSA or sepsis and that it is causing him to have "a mental disability"

## 2015-08-12 NOTE — ED Notes (Signed)
Pt was found at exit door of emergency department, pt stated that he felt better and wanted to leave.  PIV was removed by pt, no swelling, redness or bleeding noted.  pt had steady gait, thanked us for treatment and exited outside.

## 2015-08-12 NOTE — ED Notes (Signed)
Pt walked out of room stating he needed to use the bathroom. Pt walked out of building to parking lot, pt would not answer RN's questions. This RN returned to room where patients wife was sitting and asked his wife if he was staying to see MD. Pt's wife stated he was gone to bathroom, when I informed  Wife that patient went out the front door, wife picked up all of her stuff and his and left also.

## 2015-08-13 NOTE — ED Provider Notes (Signed)
Patient eloped prior to my evaluation.   Miguel MemosJason Cashius Grandstaff, MD 08/13/15 [phone]

## 2015-08-21 ENCOUNTER — Encounter (HOSPITAL_COMMUNITY): Payer: Self-pay | Admitting: *Deleted

## 2015-08-21 ENCOUNTER — Emergency Department (HOSPITAL_COMMUNITY)
Admission: EM | Admit: 2015-08-21 | Discharge: 2015-08-21 | Disposition: A | Discharge status: Home / Self Care | Payer: Self-pay | Attending: Emergency Medicine | Admitting: Emergency Medicine

## 2015-08-21 ENCOUNTER — Emergency Department (HOSPITAL_COMMUNITY)
Admission: EM | Admit: 2015-08-21 | Discharge: 2015-08-21 | Disposition: A | Discharge status: Home / Self Care | Payer: Self-pay

## 2015-08-21 DIAGNOSIS — F17200 Nicotine dependence, unspecified, uncomplicated: Secondary | ICD-10-CM | POA: Insufficient documentation

## 2015-08-21 DIAGNOSIS — R440 Auditory hallucinations: Secondary | ICD-10-CM | POA: Insufficient documentation

## 2015-08-21 DIAGNOSIS — F329 Major depressive disorder, single episode, unspecified: Secondary | ICD-10-CM | POA: Insufficient documentation

## 2015-08-21 DIAGNOSIS — F1510 Other stimulant abuse, uncomplicated: Secondary | ICD-10-CM | POA: Insufficient documentation

## 2015-08-21 DIAGNOSIS — IMO0002 Reserved for concepts with insufficient information to code with codable children: Secondary | ICD-10-CM

## 2015-08-21 DIAGNOSIS — I10 Essential (primary) hypertension: Secondary | ICD-10-CM | POA: Insufficient documentation

## 2015-08-21 DIAGNOSIS — F919 Conduct disorder, unspecified: Secondary | ICD-10-CM | POA: Insufficient documentation

## 2015-08-21 DIAGNOSIS — Z79899 Other long term (current) drug therapy: Secondary | ICD-10-CM | POA: Insufficient documentation

## 2015-08-21 LAB — COMPREHENSIVE METABOLIC PANEL
ALK PHOS: 108 U/L (ref 38–126)
ALT: 21 U/L (ref 17–63)
ANION GAP: 5 (ref 5–15)
AST: 21 U/L (ref 15–41)
Albumin: 3.9 g/dL (ref 3.5–5.0)
BILIRUBIN TOTAL: 0.4 mg/dL (ref 0.3–1.2)
BUN: 17 mg/dL (ref 6–20)
CALCIUM: 9.2 mg/dL (ref 8.9–10.3)
CO2: 24 mmol/L (ref 22–32)
Chloride: 110 mmol/L (ref 101–111)
Creatinine, Ser: 0.8 mg/dL (ref 0.61–1.24)
GFR calc non Af Amer: >60 mL/min (ref >60)
Glucose, Bld: 100 mg/dL — ABNORMAL HIGH (ref 65–99)
Potassium: 4 mmol/L (ref 3.5–5.1)
SODIUM: 139 mmol/L (ref 135–145)
TOTAL PROTEIN: 6.3 g/dL — AB (ref 6.5–8.1)

## 2015-08-21 LAB — RAPID URINE DRUG SCREEN, HOSP PERFORMED
AMPHETAMINES: POSITIVE — AB
BARBITURATES: NOT DETECTED
Benzodiazepines: NOT DETECTED
Cocaine: NOT DETECTED
Opiates: NOT DETECTED
Tetrahydrocannabinol: NOT DETECTED

## 2015-08-21 LAB — CBC
HEMATOCRIT: 44.2 % (ref 39.0–52.0)
Hemoglobin: 14.4 g/dL (ref 13.0–17.0)
MCH: 30.7 pg (ref 26.0–34.0)
MCHC: 32.6 g/dL (ref 30.0–36.0)
MCV: 94.2 fL (ref 78.0–100.0)
PLATELETS: 219 10*3/uL (ref 150–400)
RBC: 4.69 MIL/uL (ref 4.22–5.81)
RDW: 14 % (ref 11.5–15.5)
WBC: 6.8 10*3/uL (ref 4.0–10.5)

## 2015-08-21 LAB — ACETAMINOPHEN LEVEL

## 2015-08-21 LAB — SALICYLATE LEVEL

## 2015-08-21 LAB — ETHANOL: Alcohol, Ethyl (B): <5 mg/dL (ref <5)

## 2015-08-21 NOTE — ED Notes (Addendum)
Pt not in room. Neither the family member or the pt told anyone (staff) that they were leaving.   Charge RN Minerva Areolaric searched department for pt or family member, neither were able to be found in the department.   [facility] also notified that the pt has left the department.

## 2015-08-21 NOTE — ED Notes (Signed)
Made 3 calls in waiting room with no answer also called in the parking lot.

## 2015-08-21 NOTE — ED Notes (Signed)
Dr. Pfeiffer at bedside   

## 2015-08-21 NOTE — ED Notes (Signed)
Pt had pocket knife on side.  He was ask to give same to his wife and he did. Pt sitting calmly in room with family awaiting triage nurse.

## 2015-08-21 NOTE — ED Provider Notes (Signed)
CSN: [id number]     Arrival date & time 08/21/15  2031 History   First MD Initiated Contact with Patient 08/21/15 2143     Chief Complaint  Patient presents with  . Hallucinations  . Medical Clearance     (Consider location/radiation/quality/duration/timing/severity/associated sxs/prior Treatment) HPI  Patient has been having problems with hearing voices. They are whispering and indistinct. The patient's wife reports he has been very erratic in his behavior. She reports normally he is very calm and kind person. However over the past several weeks he has developed episodes of hostility that been atypical. Patient reports that he feels dizzy at times. The patient's wife reports that she feels that the [facility] that [facility] had [facility] is responsible for a lot of his symptoms. Past Medical History  Diagnosis Date  . Hypertension   . Depression    History reviewed. No pertinent past surgical history. No family history on file. Social History  Substance Use Topics  . Smoking status: Current Every Day Smoker  . Smokeless tobacco: Never Used  . Alcohol Use: No    Review of Systems  10 Systems reviewed and are negative for acute change except as noted in the HPI.   Allergies  Review of patient's allergies indicates no known allergies.  Home Medications   Prior to Admission medications   Medication Sig Start Date End Date Taking? Authorizing Provider  amitriptyline (ELAVIL) 25 MG tablet Take 1 tablet (25 mg total) by mouth at bedtime. 07/26/15  Yes Gilda Creasehristopher J Pollina, MD  amphetamine-dextroamphetamine (ADDERALL) 10 MG tablet Take 10 mg by mouth daily with breakfast.   Yes Historical Provider, MD   BP 137/93 mmHg  Pulse 93  Temp(Src) 98.4 F (36.9 C) (Oral)  Resp 18  SpO2 99% Physical Exam  Constitutional: He is oriented to person, place, and time. He appears well-developed and well-nourished.  HENT:  Head: Normocephalic and atraumatic.  Eyes: EOM  are normal. Pupils are equal, round, and reactive to light.  Neck: Neck supple.  Cardiovascular: Normal rate, regular rhythm, normal heart sounds and intact distal pulses.   Pulmonary/Chest: Effort normal and breath sounds normal.  Abdominal: Soft. Bowel sounds are normal. He exhibits no distension. There is no tenderness.  Musculoskeletal: Normal range of motion. He exhibits no edema.  Neurological: He is alert and oriented to person, place, and time. He has normal strength. No cranial nerve deficit. He exhibits normal muscle tone. Coordination normal. GCS eye subscore is 4. GCS verbal subscore is 5. GCS motor subscore is 6.  Patient is able to amblyopia coronary gait. He does have normal heel toe walk. No asterixis or tremor.  Skin: Skin is warm, dry and intact.  Psychiatric: He has a normal mood and affect.    ED Course  Procedures (including critical care time) Labs Review Labs Reviewed  COMPREHENSIVE METABOLIC PANEL - Abnormal; Notable for the following:    Glucose, Bld 100 (*)    Total Protein 6.3 (*)    All other components within normal limits  URINE RAPID DRUG SCREEN, HOSP PERFORMED - Abnormal; Notable for the following:    Amphetamines POSITIVE (*)    All other components within normal limits  ACETAMINOPHEN LEVEL - Abnormal; Notable for the following:    Acetaminophen (Tylenol), Serum <10 (*)    All other components within normal limits  ETHANOL  CBC  SALICYLATE LEVEL  RPR  VITAMIN B1  VITAMIN B12  VITAMIN B6    Imaging Review No results found.  I have personally reviewed and evaluated these images and lab results as part of my medical decision-making.   EKG Interpretation None      MDM   Final diagnoses:  Auditory hallucination  Behavior disorder   Patient is a CT head done in August with no acute of abnormalities. Patient's neurologic examination is intact. He does not have cerebellar dysfunction or localizing neurologic dysfunction. He is alert and  oriented. I do not find signs of acute infectious toxicity. Currently the patient is cleared for psychiatric evaluation.    Arby Barrette, MD 08/21/15 2224

## 2015-08-21 NOTE — ED Notes (Signed)
Patient presents with family (wife) stating he has been talking to himself for several months and they have been following up with [facility] and none of the meds have been working,

## 2015-08-21 NOTE — BH Assessment (Signed)
Received TTS consult request. Contacted Enriqueta ShutterLynnze Bishop, RN who said Pt has left AMA.   Harlin RainFord Ellis Patsy BaltimoreWarrick Jr, LPC, [facility], [facility] Triage Specialist [phone]

## 2015-09-01 ENCOUNTER — Telehealth (HOSPITAL_BASED_OUTPATIENT_CLINIC_OR_DEPARTMENT_OTHER): Payer: Self-pay | Admitting: Emergency Medicine

## 2015-09-01 ENCOUNTER — Emergency Department (HOSPITAL_COMMUNITY): Payer: Self-pay

## 2015-09-01 ENCOUNTER — Encounter (HOSPITAL_COMMUNITY): Payer: Self-pay

## 2015-09-01 ENCOUNTER — Emergency Department (HOSPITAL_COMMUNITY)
Admission: EM | Admit: 2015-09-01 | Discharge: 2015-09-02 | Disposition: A | Discharge status: Home / Self Care | Payer: Self-pay | Attending: Emergency Medicine | Admitting: Emergency Medicine

## 2015-09-01 DIAGNOSIS — F1510 Other stimulant abuse, uncomplicated: Secondary | ICD-10-CM | POA: Insufficient documentation

## 2015-09-01 DIAGNOSIS — Z79899 Other long term (current) drug therapy: Secondary | ICD-10-CM | POA: Insufficient documentation

## 2015-09-01 DIAGNOSIS — R42 Dizziness and giddiness: Secondary | ICD-10-CM | POA: Insufficient documentation

## 2015-09-01 DIAGNOSIS — F1310 Sedative, hypnotic or anxiolytic abuse, uncomplicated: Secondary | ICD-10-CM | POA: Insufficient documentation

## 2015-09-01 DIAGNOSIS — R519 Headache, unspecified: Secondary | ICD-10-CM

## 2015-09-01 DIAGNOSIS — F17200 Nicotine dependence, unspecified, uncomplicated: Secondary | ICD-10-CM | POA: Insufficient documentation

## 2015-09-01 DIAGNOSIS — R109 Unspecified abdominal pain: Secondary | ICD-10-CM | POA: Insufficient documentation

## 2015-09-01 DIAGNOSIS — R51 Headache: Secondary | ICD-10-CM | POA: Insufficient documentation

## 2015-09-01 DIAGNOSIS — R440 Auditory hallucinations: Secondary | ICD-10-CM | POA: Insufficient documentation

## 2015-09-01 DIAGNOSIS — I10 Essential (primary) hypertension: Secondary | ICD-10-CM | POA: Insufficient documentation

## 2015-09-01 HISTORY — DX: Gastritis, unspecified, without bleeding: K29.70

## 2015-09-01 HISTORY — DX: Helicobacter pylori (H. pylori) as the cause of diseases classified elsewhere: B96.81

## 2015-09-01 LAB — COMPREHENSIVE METABOLIC PANEL
ALT: 23 U/L (ref 17–63)
ANION GAP: 7 (ref 5–15)
AST: 27 U/L (ref 15–41)
Albumin: 3.6 g/dL (ref 3.5–5.0)
Alkaline Phosphatase: 111 U/L (ref 38–126)
BUN: 14 mg/dL (ref 6–20)
CALCIUM: 9 mg/dL (ref 8.9–10.3)
CHLORIDE: 108 mmol/L (ref 101–111)
CO2: 25 mmol/L (ref 22–32)
CREATININE: 0.8 mg/dL (ref 0.61–1.24)
Glucose, Bld: 97 mg/dL (ref 65–99)
Potassium: 4.1 mmol/L (ref 3.5–5.1)
SODIUM: 140 mmol/L (ref 135–145)
Total Bilirubin: 0.5 mg/dL (ref 0.3–1.2)
Total Protein: 6.2 g/dL — ABNORMAL LOW (ref 6.5–8.1)

## 2015-09-01 LAB — CBC WITH DIFFERENTIAL/PLATELET
BASOS ABS: 0 10*3/uL (ref 0.0–0.1)
Basophils Relative: 0 %
EOS ABS: 0.2 10*3/uL (ref 0.0–0.7)
EOS PCT: 2 %
HCT: 42.6 % (ref 39.0–52.0)
HEMOGLOBIN: 14.4 g/dL (ref 13.0–17.0)
LYMPHS ABS: 1.5 10*3/uL (ref 0.7–4.0)
LYMPHS PCT: 19 %
MCH: 31.4 pg (ref 26.0–34.0)
MCHC: 33.8 g/dL (ref 30.0–36.0)
MCV: 93 fL (ref 78.0–100.0)
Monocytes Absolute: 0.6 10*3/uL (ref 0.1–1.0)
Monocytes Relative: 8 %
NEUTROS PCT: 71 %
Neutro Abs: 5.7 10*3/uL (ref 1.7–7.7)
PLATELETS: 212 10*3/uL (ref 150–400)
RBC: 4.58 MIL/uL (ref 4.22–5.81)
RDW: 14 % (ref 11.5–15.5)
WBC: 8.1 10*3/uL (ref 4.0–10.5)

## 2015-09-01 LAB — I-STAT CREATININE, ED: CREATININE: 0.8 mg/dL (ref 0.61–1.24)

## 2015-09-01 LAB — ACETAMINOPHEN LEVEL

## 2015-09-01 LAB — LIPASE, BLOOD: LIPASE: 27 U/L (ref 11–51)

## 2015-09-01 LAB — SALICYLATE LEVEL

## 2015-09-01 LAB — ETHANOL

## 2015-09-01 MED ORDER — GADOBENATE DIMEGLUMINE 529 MG/ML IV SOLN
15.0000 mL | Freq: Once | INTRAVENOUS | Status: AC | PRN
  Administered 2015-09-01: 15 mL via INTRAVENOUS

## 2015-09-01 MED ORDER — METOCLOPRAMIDE HCL 5 MG/ML IJ SOLN
10.0000 mg | Freq: Once | INTRAMUSCULAR | Status: AC
  Administered 2015-09-01: 10 mg via INTRAVENOUS
  Filled 2015-09-01: qty 2

## 2015-09-01 MED ORDER — ACETAMINOPHEN 325 MG PO TABS: 650.0000 mg | ORAL_TABLET | ORAL | Status: DC | PRN

## 2015-09-01 MED ORDER — LORAZEPAM 1 MG PO TABS: 1.0000 mg | ORAL_TABLET | Freq: Three times a day (TID) | ORAL | Status: DC | PRN

## 2015-09-01 MED ORDER — DIPHENHYDRAMINE HCL 50 MG/ML IJ SOLN
25.0000 mg | Freq: Once | INTRAMUSCULAR | Status: AC
  Administered 2015-09-01: 25 mg via INTRAVENOUS
  Filled 2015-09-01: qty 1

## 2015-09-01 MED ORDER — ZOLPIDEM TARTRATE 5 MG PO TABS: 5.0000 mg | ORAL_TABLET | Freq: Every evening | ORAL | Status: DC | PRN

## 2015-09-01 MED ORDER — SODIUM CHLORIDE 0.9 % IV BOLUS (SEPSIS)
1000.0000 mL | Freq: Once | INTRAVENOUS | Status: AC
  Administered 2015-09-01: 1000 mL via INTRAVENOUS

## 2015-09-01 MED ORDER — IBUPROFEN 400 MG PO TABS: 600.0000 mg | ORAL_TABLET | Freq: Three times a day (TID) | ORAL | Status: DC | PRN

## 2015-09-01 NOTE — ED Notes (Signed)
Returned from MRI 

## 2015-09-01 NOTE — ED Notes (Addendum)
Pt states he "has been here 3 times in last week for the same thing and you don't ever do anything." Explained process to patient but he puts on coat and walks to door. Pt urged to remain and work through process. Pt walks out. Pt noted as walking out front door. With wife and child following behind. Pt again urged to remain and security contacted. Dr.Floyd notified of information. Charge Nurse Shanda BumpsJessica informed of information. [employer] contacted by Charge Nurse Shanda BumpsJessica

## 2015-09-01 NOTE — ED Provider Notes (Addendum)
CSN: [id number]     Arrival date & time 09/01/15  1827 History   First MD Initiated Contact with Patient 09/01/15 1958     Chief Complaint  Patient presents with  . Headache     (Consider location/radiation/quality/duration/timing/severity/associated sxs/prior Treatment) HPI  44 year old male presents with a chief complaint of headache. The wife does most of the history and talks about many different things. Chief complaint today seems to be a headache that is posterior. He has had this intermittently for the past 8 months. Also having intermittent dizziness. Throughout this time he the wife has also noticed she has become more delusional and having hallucinations. She endorses he has had fevers, highest temperature has been 99.8 per her. No recent travel, moved [place] from [place] 12 years ago. Wife endorses no history of psychiatric disease. No focal weakness or numbness. Wife also wants patient treated for H pylori, [place] a positive blood [place] in [place] months ago but never got treated. Complains of intermittent abdominal bloating, cramps and very poor/foul-smelling breath. No SI/HI. However, patient did apparently push his wife up against the wall in anger and his wife says this was because he was delusional. He has never hurt her physically and she has no fear living with him. No threats.  Past Medical History  Diagnosis Date  . Hypertension   . Depression   . Helicobacter positive gastritis    History reviewed. No pertinent past surgical history. History reviewed. No pertinent family history. Social History  Substance Use Topics  . Smoking status: Current Every Day Smoker -- 1.00 packs/day  . Smokeless tobacco: Never Used  . Alcohol Use: No    Review of Systems  Cardiovascular: Negative for chest pain.  Gastrointestinal: Positive for abdominal pain. Negative for vomiting.  Neurological: Positive for dizziness and headaches.  Psychiatric/Behavioral: Positive for hallucinations.  All  other systems reviewed and are negative.     Allergies  Review of patient's allergies indicates no known allergies.  Home Medications   Prior to Admission medications   Medication Sig Start Date End Date Taking? Authorizing Provider  amitriptyline (ELAVIL) 25 MG tablet Take 1 tablet (25 mg total) by mouth at bedtime. 07/26/15  Yes Gilda Creasehristopher J Pollina, MD  amphetamine-dextroamphetamine (ADDERALL) 10 MG tablet Take 10 mg by mouth daily with breakfast.   Yes Historical Provider, MD   BP 139/93 mmHg  Pulse 74  Temp(Src) 97.6 F (36.4 C) (Oral)  Resp 16  Ht 5\' 9"  (1.753 m)  Wt 159 lb (72.122 kg)  BMI 23.47 kg/m2  SpO2 99% Physical Exam  Constitutional: He is oriented to person, place, and time. He appears well-developed and well-nourished.  HENT:  Head: Normocephalic and atraumatic.  Right Ear: External ear normal.  Left Ear: External ear normal.  Nose: Nose normal.  Eyes: EOM are normal. Pupils are equal, round, and reactive to light. Right eye exhibits no discharge. Left eye exhibits no discharge.  Neck: Normal range of motion. Neck supple.  No meningismus  Cardiovascular: Normal rate, regular rhythm, normal heart sounds and intact distal pulses.   Pulmonary/Chest: Effort normal and breath sounds normal.  Abdominal: Soft. There is no tenderness.  Musculoskeletal: He exhibits no edema.  Neurological: He is alert and oriented to person, place, and time.  CN 2-12 grossly intact. 5/5 strength in all 4 extremities  Skin: Skin is warm and dry.  Nursing note and vitals reviewed.   ED Course  Procedures (including critical care time) Labs Review Labs Reviewed  COMPREHENSIVE METABOLIC  PANEL - Abnormal; Notable for the following:    Total Protein 6.2 (*)    All other components within normal limits  ACETAMINOPHEN LEVEL - Abnormal; Notable for the following:    Acetaminophen (Tylenol), Serum <10 (*)    All other components within normal limits  LIPASE, BLOOD  CBC WITH  DIFFERENTIAL/PLATELET  ETHANOL  SALICYLATE LEVEL  URINE RAPID DRUG SCREEN, HOSP PERFORMED  URINALYSIS, ROUTINE W REFLEX MICROSCOPIC (NOT AT [facility])  TSH  I-STAT CREATININE, ED    Imaging Review Mr Laqueta Jean Wo Contrast  09/01/2015  CLINICAL DATA:  Initial evaluation for 8 months of headache. New psych issues, hallucinations. EXAM: MRI HEAD WITHOUT AND WITH CONTRAST TECHNIQUE: Multiplanar, multiecho pulse sequences of the brain and surrounding structures were obtained without and with intravenous contrast. CONTRAST:  15mL MULTIHANCE GADOBENATE DIMEGLUMINE 529 MG/ML IV SOLN COMPARISON:  Prior head CT from 05/11/2015. FINDINGS: The CSF containing spaces are within normal limits for patient age. Few tiny foci of T2/FLAIR hyperintensity present within the subcortical white matter both frontal lobes, of doubtful clinical significance. No mass lesion, midline shift, or extra-axial fluid collection. Ventricles are normal in size without evidence of hydrocephalus. No diffusion-weighted signal abnormality is identified to suggest acute intracranial infarct. Gray-white matter differentiation is maintained. Normal flow voids are seen within the intracranial vasculature. No intracranial hemorrhage identified. 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 Craniocervical junction is otherwise unremarkable. Pituitary gland is within normal limits. Pituitary stalk is midline. The globes and optic nerves demonstrate a normal appearance with normal signal intensity. No abnormal enhancement. The bone marrow signal intensity is normal. Calvarium is intact. Visualized upper cervical spine is within normal limits. Scalp soft tissues are unremarkable. Mild scattered mucosal thickening within the ethmoidal air cells and maxillary sinuses. No air-fluid levels to suggest active sinus infection. No mastoid effusion. IMPRESSION: 1. Cerebellar tonsillar ectopia of 2 mm. 2. Otherwise normal brain MRI. Electronically Signed   By: Rise Mu M.D.   On: 09/01/2015 22:30   I have personally reviewed and evaluated these images and lab results as part of my medical decision-making.   EKG Interpretation   Date/Time:  Tuesday September 01 2015 20:31:55 EST Ventricular Rate:  76 PR Interval:  127 QRS Duration: 84 QT Interval:  397 QTC Calculation: 446 R Axis:   87 Text Interpretation:  Sinus rhythm no significant change since Nov 2016  Confirmed by Criss Alvine  MD, Nashly Olsson [phone]) on 09/01/2015 9:38:52 PM      MDM   Final diagnoses:  Occipital headache    Given the patient's headaches for several months as well as new onset delusions and hallucinations and MRI was obtained to rule out a mass or possibly occult intracranial infection. His MRI is essentially normal. I feel the patient is medically cleared. He probably did have an episode of being agitated but here is calm and does not appear acutely psychotic. Given he does not appear to have an acute medical emergency causing his symptoms, will consult psych for evaluation of his delusions/hallucinations. As for his possible H Pylori, he does not appear to have an acute abdominal emergency, and while he may have H Pylori he does not appear to have acute PUD, and I believe he can be referred to GI and likely get an EGD.    Pricilla Loveless, MD 09/01/15 1191  Pricilla Loveless, MD 09/01/15 4782  Pricilla Loveless, MD 09/01/15 2350

## 2015-09-01 NOTE — ED Notes (Signed)
Pt wife April Pinkerton [phone]

## 2015-09-01 NOTE — ED Notes (Signed)
IVC paperwork given to Dr. Adela LankFloyd as per his request.

## 2015-09-01 NOTE — ED Notes (Addendum)
Pt arrives ambulatory with c/o headache x 2 hours. Pt states he also got upset earlier.calm and cooperative at triage. Wife states pt grabbed her and pushed against the wal;l and told her she knew who was talking to him. When asked pt admits he ndid this but does not answer when questioned when asked about voices talking to him. After wife leaves room pt states he does not hear voices and he puyshed her agaoinst wall because she is cheating on him.

## 2015-09-01 NOTE — ED Notes (Signed)
Pt wife states that pt has been altered for the past 6-8 months, states that he has a constant odor to his breath and five years ago pt, herself and daughter all had H. Pylori, but pt never took antibiotics for it, and she believes this is what is wrong with the patient. States pt has had unexplained weight loss and spells of dizziness, unsteady gait and hallucinations. Pt appears withdrawn, with periods of agitation, c/o lower neck pain and headache. Pt denies AVH.

## 2015-09-01 NOTE — ED Notes (Signed)
MD at bedside. 

## 2015-09-02 LAB — TSH: TSH: 1.699 u[IU]/mL (ref 0.350–4.500)

## 2015-09-02 LAB — URINALYSIS, ROUTINE W REFLEX MICROSCOPIC
Bilirubin Urine: NEGATIVE
GLUCOSE, UA: NEGATIVE mg/dL
Hgb urine dipstick: NEGATIVE
Ketones, ur: NEGATIVE mg/dL
LEUKOCYTES UA: NEGATIVE
NITRITE: NEGATIVE
PH: 6 (ref 5.0–8.0)
Protein, ur: NEGATIVE mg/dL
SPECIFIC GRAVITY, URINE: 1.026 (ref 1.005–1.030)

## 2015-09-02 LAB — RAPID URINE DRUG SCREEN, HOSP PERFORMED
AMPHETAMINES: POSITIVE — AB
Barbiturates: NOT DETECTED
Benzodiazepines: POSITIVE — AB
COCAINE: NOT DETECTED
OPIATES: NOT DETECTED
TETRAHYDROCANNABINOL: NOT DETECTED

## 2015-09-02 NOTE — BH Assessment (Signed)
Tele Assessment Note   Miguel Contreras is an 44 y.o. male, Hispanic, married who presents to the ER accompanied by wife with complaints of delusions and worsening mental state. Patient identifies insomnia and anxiety as primary concerns. Patient believes that he has discovered wires in mouth and other various problems on his body, and expresses that it feels like someone is placing a microphone on him. Patient expressed that he will not sleep for 2 days at a time and then when he does he go to sleep that he will sleep for 12-14 hours, and when he awakes it like" I am living in a movie and having these events replay, and I have to relive them over and over again". Patient does report a history of psychotic symptoms in which he has auditory and visual hallucinations.  Patient denies current SI/HI and any past history. Patient denies any current or past history of substance abuse. Patient reports that he does currently have a job, and that he lives with his wife and daughter of 43 years old. Patient reports that he [facility], but that he wishes to see one clinician and not several different clinicians. Patient reports that he does not have a primary care physician or provider. Patent does acknowledge current and worsening auditory and visual hallucinations. Patient stated that the voices are 1-2 voices that he odes know[did not specify], and that the voices have gotten worse and started to make commands to hurt others, but that he does not follow the commands.   Wife states that pt. Has been seen by numerous providers and that she does not wish to have pt. Be a "[place]" and prescribed numerous medications. Wife reports that medications have made his condition worse rather than helping. Wife states that she is familiar also with the IVC process and several police officers and willing to pursue that should her husbands safety for himself be questionable as far as pt.  Being dangerous to self or others. Wife expressed she is not willing to let pt. Stay any location at present for treatment for more than 24 hours because she is not willing to have the pt. On various medications. Most notably wife mentioned that pt. Had been diagnosed with H Pylori [bacterial infection] at or around 5 years ago, but pt. Refused to take antibiotics, and since that point pt. Mental state has been declining.  Patient is dressed in scrubs and is alert and oriented x4, but is in an awaking state. Patient speech was within normal limits and motor behavior appeared normal. Patient thought process is coherent, but at times circular with fixation on paranoia about being monitored. Patient does not appear to be responding to internal stimuli at this time. Patient was cooperative throughout the assessment and states that he is not agreeable to inpatient psychiatric treatment.   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 Hypertension   . Depression   . Helicobacter positive gastritis     History reviewed. No pertinent past surgical history.  Family History: History reviewed. No pertinent family history.  Social History:  reports that he has been smoking.  He has never used smokeless tobacco. He reports that he does not drink alcohol or use illicit drugs.  Additional Social History:  Alcohol / Drug Use Pain Medications: SEE MAR Prescriptions: SEE MAR Over the Counter: SEE MAR History of alcohol / drug  use?: No history of alcohol / drug abuse Longest period of sobriety (when/how long): NA  CIWA: CIWA-Ar BP: 119/82 mmHg Pulse Rate: 66 COWS:    PATIENT STRENGTHS: (choose at least two) Communication skills General fund of knowledge  Allergies: No Known Allergies  Home Medications:  (Not in a hospital admission)  OB/GYN Status:  No LMP for male patient.  General  Assessment Data Location of Assessment: [facility] ED TTS Assessment: In system Is this a Tele or Face-to-Face Assessment?: Tele Assessment Is this an Initial Assessment or a Re-assessment for this encounter?: Initial Assessment Marital status: Married [place] name: NA Is patient pregnant?: No Pregnancy Status: No Living Arrangements: Spouse/significant other Can pt return to current living arrangement?: Yes Admission Status: Voluntary Is patient capable of signing voluntary admission?: Yes Referral Source: Self/Family/Friend Insurance type: None  Medical Screening Exam [facility] Walk-in ONLY) Medical Exam completed: Yes  Crisis Care Plan Living Arrangements: Spouse/significant other Name of Psychiatrist: Transport planner Name of Therapist: [facility]  Education Status Is patient currently in school?: No Current Grade: NA Highest grade of school patient has completed: unknown Name of school: unknown Contact person: Technical sales engineer (wife)  Risk to self with the past 6 months Suicidal Ideation: No Has patient been a risk to self within the past 6 months prior to admission? : No Suicidal Intent: No Has patient had any suicidal intent within the past 6 months prior to admission? : No Is patient at risk for suicide?: No Suicidal Plan?: No Has patient had any suicidal plan within the past 6 months prior to admission? : No Access to Means: No What has been your use of drugs/alcohol within the last 12 months?: none Previous Attempts/Gestures: No How many times?: 0 Other Self Harm Risks: none noted Triggers for Past Attempts: Unknown Intentional Self Injurious Behavior: None Family Suicide History: Unknown Recent stressful life event(s): Turmoil (Comment) (sleep declining/ no sleep up to days at a time) Persecutory voices/beliefs?: No Depression: Yes Depression Symptoms: Insomnia Substance abuse history and/or treatment for substance abuse?: No Suicide prevention information given to non-admitted  patients: Not applicable  Risk to Others within the past 6 months Homicidal Ideation: No Does patient have any lifetime risk of violence toward others beyond the six months prior to admission? : No Thoughts of Harm to Others: No Current Homicidal Intent: No Current Homicidal Plan: No Access to Homicidal Means: No Identified Victim: NA History of harm to others?: No Assessment of Violence: None Noted Violent Behavior Description: none noted Does patient have access to weapons?: No Criminal Charges Pending?: No Does patient have a court date: No Is patient on probation?: No  Psychosis Hallucinations: Auditory, Visual, With command (command to hurt but pt. states does not respond to it) Delusions: Unspecified  Mental Status Report Appearance/Hygiene: In scrubs Eye Contact: Good Motor Activity: Unremarkable Speech: Slow, Soft, Unremarkable Level of Consciousness: Drowsy, Alert Mood: Ambivalent, Ashamed/humiliated, Despair Affect: Frightened, Irritable, Preoccupied Anxiety Level: Moderate Thought Processes: Circumstantial Judgement: Partial Orientation: Person, Place, Time, Situation, Appropriate for developmental age Obsessive Compulsive Thoughts/Behaviors: None  Cognitive Functioning Concentration: Normal Memory: Recent Intact, Remote Intact IQ: Average Insight: Good Impulse Control: Good Appetite: Good Weight Loss: 0 Weight Gain: 0 Sleep: Decreased Total Hours of Sleep: 4 (pt no sleep for 2 days at time then sleeps 12-14 hour after) Vegetative Symptoms: None  ADLScreening [facility] Assessment Services) Patient's cognitive ability adequate to safely complete daily activities?: Yes Patient able to express need for assistance with ADLs?: Yes Independently performs ADLs?: Yes (appropriate for  developmental age)  Prior Inpatient Therapy Prior Inpatient Therapy: No Prior Therapy Dates: NA Prior Therapy Facilty/Provider(s): NA Reason for Treatment: NA  Prior Outpatient  Therapy Prior Outpatient Therapy: Yes Prior Therapy Dates: current  Prior Therapy Facilty/Provider(s): [facility] Reason for Treatment: psychotic symptoms/ insomnia (hallucinations/ paranoia) Does patient have an ACCT team?: No Does patient have Intensive In-House Services?  : No Does patient have [facility] services? : Yes Does patient have P4CC services?: No  ADL Screening (condition at time of admission) Patient's cognitive ability adequate to safely complete daily activities?: Yes Is the patient deaf or have difficulty hearing?: No Does the patient have difficulty seeing, even when wearing glasses/contacts?: No Does the patient have difficulty concentrating, remembering, or making decisions?: No Patient able to express need for assistance with ADLs?: Yes Does the patient have difficulty dressing or bathing?: No Independently performs ADLs?: Yes (appropriate for developmental age) Does the patient have difficulty walking or climbing stairs?: No Weakness of Legs: None Weakness of Arms/Hands: None  Home Assistive Devices/Equipment Home Assistive Devices/Equipment: None    Abuse/Neglect Assessment (Assessment to be complete while patient is alone) Physical Abuse: Denies Verbal Abuse: Denies Sexual Abuse: Denies Exploitation of patient/patient's resources: Denies Self-Neglect: Denies Values / Beliefs Cultural Requests During Hospitalization: None Spiritual Requests During Hospitalization: None   Advance Directives (For Healthcare) Does patient have an advance directive?: No Would patient like information on creating an advanced directive?: No - patient declined information    Additional Information 1:1 In Past 12 Months?: No CIRT Risk: No Elopement Risk: No Does patient have medical clearance?: Yes     Disposition: Per Karleen Hampshire, PA patient meets inpatient criteria for psychiatric treatment. Disposition Initial Assessment Completed for this Encounter: Yes Disposition of  Patient: Other dispositions (TBD upon consult with extender)  Hipolito Bayley 09/02/2015 2:25 AM

## 2015-09-02 NOTE — ED Notes (Signed)
Pt wife states that she is leaving for the night, wife states that she wants to be contacted, before pt receives any medications and to know what the plan is after TTS is completed. Explained to wife that pt is competent to make own decisions and the would be a HIPPA violation but the pt may contact her at any time and inform her of his care. Pt wife became angry and stated that she demands to be notified of any and all care for her husband.

## 2015-09-02 NOTE — ED Notes (Signed)
Pt walked calmly to front of ED with security and this RN. Pt stated, "Thank you for all you did." Pt Asked if wife was picking him up. Stated, "Yes." Pt walked outside and started to walk away.

## 2015-09-02 NOTE — ED Notes (Addendum)
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 Spoke with MD Pfeiffer. Plan of care is to wait till pt wakes up. Reevaluate pt's cooperation to stay, and to continue with plan of care at that time.

## 2015-09-02 NOTE — BH Assessment (Signed)
Called [facility] charge nurse for cart for tele-psych at 0109 on 09/02/15. Afterwards consulted with Karleen HampshireSpencer, PA who stated that pt. Meets criteria for inpatient psychiatric care. Per Joseph Berkshireorrie, RN [facility]. TTS to seek placement. Keaton Beichner K. Jesus GeneraHarris, LPC-A, [facility]  Counselor 09/02/2015 3:13 AM

## 2015-09-02 NOTE — ED Notes (Signed)
Pt's wife and daughter stopped this nurse at POD A nurses station requesting to talk to the doctor that took over for Dr. Criss AlvineGoldston.  Pts family is concerned about his psych medications being mixed up or starting new medications.  This nurse notified Lequita HaltMorgan the charge nurse that pts family would also like to talk to her as she is complaining about treatment from staff.  Charge said she would be there shortly if family could wait in pts room.  Dr Wilkie AyeHorton also notified of pts request to speak with her about pts care.  Dr. Wilkie AyeHorton said that pts family needs to go through charge nurse first then if it was still needed she would speak with her but is unable to at this time due to the needs of other patients.

## 2015-09-02 NOTE — Discharge Instructions (Signed)
General Headache Without Cause A headache is pain or discomfort felt around the head or neck area. The specific cause of a headache may not be found. There are many causes and types of headaches. A few common ones are:  Tension headaches.  Migraine headaches.  Cluster headaches.  Chronic daily headaches. HOME CARE INSTRUCTIONS  Watch your condition for any changes. Take these steps to help with your condition: Managing Pain  Take over-the-counter and prescription medicines only as told by your health care provider.  Lie down in a dark, quiet room when you have a headache.  If directed, apply ice to the head and neck area:  Put ice in a plastic bag.  Place a towel between your skin and the bag.  Leave the ice on for 20 minutes, 2-3 times per day.  Use a heating pad or hot shower to apply heat to the head and neck area as told by your health care provider.  Keep lights dim if bright lights bother you or make your headaches worse. Eating and Drinking  Eat meals on a regular schedule.  Limit alcohol use.  Decrease the amount of caffeine you drink, or stop drinking caffeine. General Instructions  Keep all follow-up visits as told by your health care provider. This is important.  Keep a headache journal to help find out what may trigger your headaches. For example, write down:  What you eat and drink.  How much sleep you get.  Any change to your diet or medicines.  Try massage or other relaxation techniques.  Limit stress.  Sit up straight, and do not tense your muscles.  Do not use tobacco products, including cigarettes, chewing tobacco, or e-cigarettes. If you need help quitting, ask your health care provider.  Exercise regularly as told by your health care provider.  Sleep on a regular schedule. Get 7-9 hours of sleep, or the amount recommended by your health care provider. SEEK MEDICAL CARE IF:   Your symptoms are not helped by medicine.  You have a  headache that is different from the usual headache.  You have nausea or you vomit.  You have a fever. SEEK IMMEDIATE MEDICAL CARE IF:   Your headache becomes severe.  You have repeated vomiting.  You have a stiff neck.  You have a loss of vision.  You have problems with speech.  You have pain in the eye or ear.  You have muscular weakness or loss of muscle control.  You lose your balance or have trouble walking.  You feel faint or pass out.  You have confusion.   This information is not intended to replace advice given to you by your health care provider. Make sure you discuss any questions you have with your health care provider.   Document Released: 09/12/2005 Document Revised: 06/03/2015 Document Reviewed: 01/05/2015 Elsevier Interactive Patient Education 2016 Elsevier Inc. Possible Depressive Disorder Major depressive disorder is a mental illness. It also may be called clinical depression or unipolar depression. Major depressive disorder usually causes feelings of sadness, hopelessness, or helplessness. Some people with this disorder do not feel particularly sad but lose interest in doing things they used to enjoy (anhedonia). Major depressive disorder also can cause physical symptoms. It can interfere with work, school, relationships, and other normal everyday activities. The disorder varies in severity but is longer lasting and more serious than the sadness we all feel from time to time in our lives. Major depressive disorder often is triggered by stressful life events or  major life changes. Examples of these triggers include divorce, loss of your job or home, a move, and the death of a family member or close friend. Sometimes this disorder occurs for no obvious reason at all. People who have family members with major depressive disorder or bipolar disorder are at higher risk for developing this disorder, with or without life stressors. Major depressive disorder can occur at  any age. It may occur just once in your life (single episode major depressive disorder). It may occur multiple times (recurrent major depressive disorder). SYMPTOMS People with major depressive disorder have either anhedonia or depressed mood on nearly a daily basis for at least 2 weeks or longer. Symptoms of depressed mood include:  Feelings of sadness (blue or down in the dumps) or emptiness.  Feelings of hopelessness or helplessness.  Tearfulness or episodes of crying (may be observed by others).  Irritability (children and adolescents). In addition to depressed mood or anhedonia or both, people with this disorder have at least four of the following symptoms:  Difficulty sleeping or sleeping too much.   Significant change (increase or decrease) in appetite or weight.   Lack of energy or motivation.  Feelings of guilt and worthlessness.   Difficulty concentrating, remembering, or making decisions.  Unusually slow movement (psychomotor retardation) or restlessness (as observed by others).   Recurrent wishes for death, recurrent thoughts of self-harm (suicide), or a suicide attempt. People with major depressive disorder commonly have persistent negative thoughts about themselves, other people, and the world. People with severe major depressive disorder may experiencedistorted beliefs or perceptions about the world (psychotic delusions). They also may see or hear things that are not real (psychotic hallucinations). DIAGNOSIS Major depressive disorder is diagnosed through an assessment by your health care provider. Your health care provider will ask aboutaspects of your daily life, such as mood,sleep, and appetite, to see if you have the diagnostic symptoms of major depressive disorder. Your health care provider may ask about your medical history and use of alcohol or drugs, including prescription medicines. Your health care provider also may do a physical exam and blood work. This  is because certain medical conditions and the use of certain substances can cause major depressive disorder-like symptoms (secondary depression). Your health care provider also may refer you to a mental health specialist for further evaluation and treatment. TREATMENT It is important to recognize the symptoms of major depressive disorder and seek treatment. The following treatments can be prescribed for this disorder:   Medicine. Antidepressant medicines usually are prescribed. Antidepressant medicines are thought to correct chemical imbalances in the brain that are commonly associated with major depressive disorder. Other types of medicine may be added if the symptoms do not respond to antidepressant medicines alone or if psychotic delusions or hallucinations occur.  Talk therapy. Talk therapy can be helpful in treating major depressive disorder by providing support, education, and guidance. Certain types of talk therapy also can help with negative thinking (cognitive behavioral therapy) and with relationship issues that trigger this disorder (interpersonal therapy). A mental health specialist can help determine which treatment is best for you. Most people with major depressive disorder do well with a combination of medicine and talk therapy. Treatments involving electrical stimulation of the brain can be used in situations with extremely severe symptoms or when medicine and talk therapy do not work over time. These treatments include electroconvulsive therapy, transcranial magnetic stimulation, and vagal nerve stimulation.   This information is not intended to replace advice given to  you by your health care provider. Make sure you discuss any questions you have with your health care provider.   Document Released: 01/07/2013 Document Revised: 10/03/2014 Document Reviewed: 01/07/2013 Elsevier Interactive Patient Education [employer]  Emergency Department Resource Guide 1) Find a Doctor and  Pay Out of Pocket Although you won't have to find out who is covered by your insurance plan, it is a good idea to ask around and get recommendations. You will then need to call the office and see if the doctor you have chosen will accept you as a new patient and what types of options they offer for patients who are self-pay. Some doctors offer discounts or will set up payment plans for their patients who do not have insurance, but you will need to ask so you aren't surprised when you get to your appointment.  2) Contact Your Local Health Department Not all health departments have doctors that can see patients for sick visits, but many do, so it is worth a call to see if yours does. If you don't know where your local health department is, you can check in your phone book. The CDC also has a tool to help you locate your state's health department, and many state websites also have listings of all of their local health departments.  3) Find a Walk-in Clinic If your illness is not likely to be very severe or complicated, you may want to try a walk in clinic. These are popping up all over the country in pharmacies, drugstores, and shopping centers. They're usually staffed by nurse practitioners or physician assistants that have been trained to treat common illnesses and complaints. They're usually fairly quick and inexpensive. However, if you have serious medical issues or chronic medical problems, these are probably not your best option.  No Primary Care Doctor: - Call Health Connect at  [phone] - they can help you locate a primary care doctor that  accepts your insurance, provides certain services, etc. - Physician Referral Service- [phone]  Chronic Pain Problems: Organization         Address  Phone   Notes  [facility]  [phone] Patients need to be referred by their primary care doctor.   Medication Assistance: Organization         Address  Phone   Notes  [facility] Medication [facility] [address] [place] [place], [place] [address] [phone] --Must be a resident of [facility] -- Must have NO insurance coverage whatsoever (no Medicaid/ Medicare, etc.) -- The pt. MUST have a primary care doctor that directs their care regularly and follows them in the community   [facility]  [phone]   [employer]  [phone]    Agencies that provide inexpensive medical care: Organization         Address  Phone   Notes  [facility]  [phone]   [facility]    [phone]   [facility] [address] [place], [place] [address] [phone]   [facility] [address] [place], [place] [phone]   Planned Parenthood    [phone]   [facility]    [phone]   Community Health and [facility]  [address], [place] Phone:  [phone], Fax:  [phone] Hours of Operation:  9 am - 6 pm, M-F.  Also accepts Medicaid/Medicare and self-pay.  [facility]  [address], [place] Phone: [phone], Fax: [phone]. Hours of Operation:  8:30 am - 5:30 pm, M-F.  Also accepts Medicaid and self-pay.  [facility] [place] [address], [place] Phone: [phone]   [facility] [address] Natasha Bence [place], [place] [phone], Ext. [phone] Mondays & Thursdays: 7-9 AM.  First 15 patients are seen on a first come, first serve basis.    Medicaid-accepting [facility] Providers:  Organization         Address  Phone   Notes  [facility] [address], [place] [phone] Also accepts self-pay patients.  [facility] [address] Laurell Josephs [place], [place]  [phone]   [facility] [address], [place] [phone]   [facility] [address] [place] [phone]   Renaye Rakers [address], [place]   [phone] Only accepts [place] Access [place] patients after they have their name applied to their card.   Self-Pay (no insurance) in [facility]:  Organization         Address  Phone   Notes  Sickle Cell Patients, [facility] [address] [place], [place] [phone]   [facility] [address] [place], [place] [phone]   [facility] [place]  [address], [place] [phone]   [facility]/Dr. Osei-Bonsu  [address] [place] or [address], [place] [phone] Phone number for both [place] and [place] locations is the same.  [facility]gent Medical and [facility] [address], [place] [phone]   [facility] [address] [place] or [address] [phone]   [facility] [address], [place] [phone], phone; [phone], fax Sees patients 1st and 3rd Saturday of every month.  Must not qualify for public or private insurance (i.e. Medicaid, Medicare, [place] Health Choice, Veterans' Benefits)  Household income should be no more than 200% of the poverty level The clinic cannot treat you if you are pregnant or think you are pregnant  Sexually transmitted diseases are not treated at the clinic.    Dental Care: Organization         Address  Phone  Notes  [facility] [address] [place], [place] [phone] Accepts children up to age 70 who are enrolled in [place] or [place] Health Choice; pregnant women with a Medicaid card; and children who have applied for Medicaid or [place] Health Choice, but were declined, whose parents can pay a reduced fee at time of service.  [facility]  [address], [place] [phone] Accepts children up to age 12 who are enrolled in [place] or [place] Health Choice; pregnant women with a Medicaid card; and children who have applied for Medicaid or [place] Health Choice, but were declined, whose parents can pay a reduced fee at time of service.  [facility]  [address] [place], [place] [phone] Patients are seen by appointment only. Walk-ins are not accepted. [facility] will see patients 68 years of age and older. Monday - Tuesday (8am-5pm) Most Wednesdays (8:30-5pm) $30 per visit,  cash only  [employer] [facility] [employer] [facility]  [address], [place] [phone] Patients are seen by appointment only. Walk-ins are not accepted. [facility] will see patients 76 years of age and older. One Wednesday Evening (Monthly: Volunteer Based).  $30 per visit, cash only  [employer]  [phone] for adults; Children under age 52, call Graduate Pediatric Dentistry at [phone]. Children aged 29-14, please call [phone] to request a pediatric application.  Dental services are provided in all areas of dental care including fillings, crowns and bridges, complete and partial dentures, implants, gum treatment, root canals, and extractions. Preventive care is also provided. Treatment is provided to both adults and children. Patients are selected via a lottery and there is often a waiting list.   [facility] [address], [place]  [phone] [email or web address]   [facility] [address] [place], [place] [phone], Ext. [phone] Second and Fourth Thursday of each month, opens at 6:30 AM; Clinic ends at 9 AM.  Patients are seen on a first-come first-served basis, and a limited number are seen during each clinic.   [facility]  [address] Ether Griffins [place], [place] [phone]   Eligibility Requirements You must have lived in [place], [place], or  [place] counties for at least the last three months.   You cannot be eligible for state or federal sponsored [place], including [employer], [place], or [employer].   You generally cannot be eligible for healthcare insurance through your employer.    How to apply: Eligibility screenings are held every Tuesday and Wednesday afternoon from 1:00 pm until 4:00 pm. You do not need an appointment for the interview!  [facility] [address], [place], [place] [phone]   [facility] Health Department  [phone]   [facility] Health Department  [phone]   [facility] Health Department  [phone]    Behavioral Health Resources in the Community: Intensive Outpatient Programs Organization         Address  Phone  Notes  [facility] [address], [place], [place] [phone]   [facility] Outpatient [address], [place], [place] [phone]   [facility] [address] [place], [place]  [phone]   [facility] [address],  [place], [place] [phone] or [phone]   Substance Abuse Resources Organization         Address  Phone  Notes  [facility]  [phone]   [facility]  [phone]   [facility]  [phone]   Floydene Flock  [phone]   Residential & Outpatient Substance Abuse Program  [phone]   Psychological Services Organization         Address  Phone  Notes  [facility] Behavioral Health  [phone]   [facility]  [phone]   [facility] Mental Health [address], [place] [phone] or [phone]    Mobile Crisis Teams Organization         Address  Phone  Notes  [facility], Mobile Crisis Care Unit  [phone]   [facility]  [address] [place], [place] [phone]   Doristine Locks [address] [place] [phone]    Self-Help/Support Groups Organization         Address  Phone  Notes  [facility] [place] - variety of support groups  336- I7437963 Call for more information  Narcotics Anonymous (NA), [facility] [address], [employer] [place]  2 meetings at this location   Statistician         Address  Phone  Notes  [facility] [address],    [place]  [phone]   [facility]  [address] [place] [address], [place], [place] [phone]   [facility] [address] [place], [place] [phone] Admissions: 8am-3pm M-F  [facility] [address],    [place], [place] [phone]   [facility] [address] [place], [place], [place] [phone]   [facility] [address],  [place], [place] [phone]   [facility] - Intensive Outpatient [address] Laurell Josephs [address], [place], [place] [phone]   [facility] ([facility]iction [facility] [address] [place].,  [place], [place] [phone] or [phone]   [facility] ([facility]) [address], [place], [place] [phone] Accepts Medicaid  [facility] [address]  [place] [phone] Substance Abuse/Addiction Treatment   [facility] Organization         Address  Phone  Notes  [facility]  [phone]   Angie Fava, PhD [address] Ervin Knack [place], [place]   [phone] or [phone]   [facility] Behavioral   [address] [place], [place] [phone]   [facility] [address], [place], [place] [phone] Insurance/Medicaid/sponsorship through [facility] and Families [address]                                    [place], [place] [phone] Therapy/tele-psych/case  [facility] [address], [place] [phone]     Dr. Lolly Mustache  [phone]   [facility] [place]  [employer] [facility] Dept. 1) [address] [place] 2) [address], [place] 3)  [address], [place] [phone]  [phone]   [facility] Child Abuse Hotline 267-128-5667 or [phone] (After Hours)

## 2015-09-02 NOTE — ED Notes (Signed)
MD Pfeiffer at the bedside 

## 2015-09-02 NOTE — ED Notes (Signed)
This RN went to get security per [employer] RN d/t pt and family behavior, security made aware that MD had not filled out IVC paperwork for this pt at this time.

## 2015-09-02 NOTE — ED Notes (Signed)
Got a call from Secretary that wife is in the waiting room. Stated she was not able to come back because pt was a psych hold and I would be out to speak with her. Walked out to speak with pt's wife. Pt's wife stated, "I want to go back and see my husband. I know my rights and the fact that you can not keep me from him." Wife reassured that we have visiting hours for our psych patient's for their safety and everyone else's. Wife given information sheet about the times and the instructions for a psych pt. Wife made aware that after evaluation, Psychiatrist believed patient needed to have inpatient treatment. Pt was sleeping and when he woke up we were going to speak with patient about continuation of care. If patient refused, we would have to IVC patient. Wife stated, "I know that you can only keep him for 24 hours and if that paper is not signed, you cannot keep me from my husband. He is a part of a refugee program and if you do not let me see him I will call my lawyer. If you are not going to let me see him, I want to talk to the Charge Nurse and the Doctor. My husband needs antibiotics. That's what is wrong with him." Wife made aware that the MD was in with a critical pt, but I would gladly send out the Charge Nurse so she could speak with him. [place], RN called and let know about the situation. Charge to speak with wife.

## 2015-09-02 NOTE — ED Notes (Addendum)
Pt is calm and cooperative. Pt given belongings and ability to use the phone. Pt reports no pain at this time. Pt has no injuries noted. Pt verbalizes no needs at this time. Helped call wife. Wife did not answer.

## 2015-09-02 NOTE — ED Notes (Addendum)
[facility] that patient's wife was at the bedside and she wanted his clothes. Walked to patient's room and pt's wife was in the room unhooking the patient. Wife stated, "I would like the his clothes." I stated that I would give her the clothes at the end of the visit. Wife stated, "I am taking the patient and we are leaving." This RN stepped away to speak with MD and stated the patient was going to leave and we needed to start completing the IVC paperwork. MD Pfeiffer stated, "Okay." When walked out of room, Pt's wife and patient were walking down the hallway and both were yelling. Pt was being asked to stop and atleast let RN take out IV. Pt would not stop and continued out into waiting room with no clothing except gown and underwear. Wife was pulling patient by arm and would not yield to nurses. Pt was being talked down in the waiting room and Security at the scene. This RN went back to the MD to assess the process of the IVC paperwork. Charge and this RN with MD and stated, "I have not seen this patient. Just let him go." This RN walked back out to the waiting room. Pt was on the ground with security and with wife walking out the door. Security was trying to calm down the patient and family. This RN made Security aware of the MD's decision. Patient was brought back to the room and calmed down. Pt is calmly sitting at the bedside. Engineer, waterCharge RN and Director speaking with MD at this time.

## 2015-09-02 NOTE — B[facility] Counselor (Signed)
09/02/15  Referral sent to [place], [facility], [facility], [place], [address] [place], [place], [place] [facility] [place] [facility], [place], [facility], [address] [place] [place], [place], [place], [facility], and [place]  Antony Sian K. Jesus GeneraHarris,  LPC-A, [facility]  Counselor 09/02/2015 4:35 AM

## 2015-09-02 NOTE — ED Notes (Addendum)
Pt's wife on the hold. Let her know that patient is in the bathroom, and he would be back in a second. Wife reported she was on the way.

## 2015-09-02 NOTE — ED Notes (Signed)
Pt's wife is requesting to be contacted after TTS is complete. Pt states that she must be contacted before any decisions on care are made. RN informed.

## 2015-09-02 NOTE — ED Provider Notes (Signed)
I was asked to reevaluate the patient for IVC paperwork. He had initially been evaluated by Dr. Criss AlvineGoldston for headache. At that time Dr. Criss AlvineGoldston did not find signs of acute suicidality or homicidality. He had an MRI done for headache and an dizziness as been prolonged. He documents the patient's wife reported hallucinations and behavior changes. MRI was not found to show any acute abnormality. By Dr. Caprice RenshawGolson's estimation that time the patient was medically cleared.  Behavioral Health note indicates the patient endorses some auditory hallucinations. They also documented that he at times heard voices telling him to hurt somebody but there was nothing specified. [facility] also indicates that the patient was alert and oriented and did not show any signs of responding to internal stimuli. He also did not endorse any suicidal or homicidal ideation.  I have interviewed the patient and find that he is alert and oriented 4. He is not have any clinically ill appearance to him. He has answered all my questions appropriately. He denies suicidal or homicidal ideation. He reports at times in his life he has dealt with some depression due to situational things such as job loss. He reports once in the past he thought about jumping off a bridge which was related to job loss and feelings of desperation. He reports he's never acted on any of these feelings. He denies any feelings of harm towards anyone else. He denies currently hearing any voices. He denies any history of harm to anyone else or any actual suicide attempt.  The patient is alert and appropriate. He shows no signs of confusion or cognitive dysfunction. At this time I have no reason to believe that he is homicidal or suicidal or a threat to himself or others due to acute psychosis.  The patient's wife is not currently at the hospital. Will note however I have seen this patient in conjunction with his wife at the end of November. At that time  a interaction was very similar to that of Dr. Criss AlvineGoldston. His wife spoke of many different problems that she noted with the patient. She had multiple medical concerns which did not seem based on clear understanding of medical type illnesses. At that time again she was the predominant historian with the patient mostly sitting and being cooperative. At that time as well as in Dr. Alric RanGoldston's note she denied any worry that the patient would ever harm her. She has referred to him as being very kind and a good person. There is no apparent report at this time of any history of assault or abuse. Based on these findings I currently have no objective reason to place the patient under IVC or suspicion of there is a medical illness that requires further intervention or treatment at this time. Patient is to be discharged.  Miguel BarretteMarcy Cory Kitt, MD 09/02/15 [id number]

## 2015-10-07 ENCOUNTER — Emergency Department (HOSPITAL_COMMUNITY): Admission: EM | Admit: 2015-10-07 | Discharge: 2015-10-07 | Discharge status: Absent without leave | Payer: Self-pay

## 2015-11-07 ENCOUNTER — Encounter: Payer: Self-pay | Admitting: Emergency Medicine

## 2015-11-07 ENCOUNTER — Emergency Department
Admission: EM | Admit: 2015-11-07 | Discharge: 2015-11-07 | Discharge status: Against Medical Advice | Payer: Self-pay | Attending: Emergency Medicine | Admitting: Emergency Medicine

## 2015-11-07 DIAGNOSIS — R109 Unspecified abdominal pain: Secondary | ICD-10-CM | POA: Insufficient documentation

## 2015-11-07 DIAGNOSIS — F17200 Nicotine dependence, unspecified, uncomplicated: Secondary | ICD-10-CM | POA: Insufficient documentation

## 2015-11-07 DIAGNOSIS — R110 Nausea: Secondary | ICD-10-CM | POA: Insufficient documentation

## 2015-11-07 DIAGNOSIS — I10 Essential (primary) hypertension: Secondary | ICD-10-CM | POA: Insufficient documentation

## 2015-11-07 DIAGNOSIS — R630 Anorexia: Secondary | ICD-10-CM | POA: Insufficient documentation

## 2015-11-07 DIAGNOSIS — R42 Dizziness and giddiness: Secondary | ICD-10-CM | POA: Insufficient documentation

## 2015-11-07 DIAGNOSIS — R197 Diarrhea, unspecified: Secondary | ICD-10-CM | POA: Insufficient documentation

## 2015-11-07 LAB — COMPREHENSIVE METABOLIC PANEL
ALBUMIN: 4.3 g/dL (ref 3.5–5.0)
ALT: 18 U/L (ref 17–63)
AST: 22 U/L (ref 15–41)
Alkaline Phosphatase: 130 U/L — ABNORMAL HIGH (ref 38–126)
Anion gap: 4 — ABNORMAL LOW (ref 5–15)
BUN: 8 mg/dL (ref 6–20)
CHLORIDE: 108 mmol/L (ref 101–111)
CO2: 26 mmol/L (ref 22–32)
Calcium: 9.1 mg/dL (ref 8.9–10.3)
Creatinine, Ser: 0.74 mg/dL (ref 0.61–1.24)
GFR calc Af Amer: >60 mL/min (ref >60)
GFR calc non Af Amer: >60 mL/min (ref >60)
GLUCOSE: 103 mg/dL — AB (ref 65–99)
POTASSIUM: 4.6 mmol/L (ref 3.5–5.1)
SODIUM: 138 mmol/L (ref 135–145)
TOTAL PROTEIN: 7.1 g/dL (ref 6.5–8.1)
Total Bilirubin: 0.9 mg/dL (ref 0.3–1.2)

## 2015-11-07 LAB — CBC
HCT: 45.4 % (ref 40.0–52.0)
Hemoglobin: 15.3 g/dL (ref 13.0–18.0)
MCH: 30.2 pg (ref 26.0–34.0)
MCHC: 33.7 g/dL (ref 32.0–36.0)
MCV: 89.7 fL (ref 80.0–100.0)
PLATELETS: 199 10*3/uL (ref 150–440)
RBC: 5.06 MIL/uL (ref 4.40–5.90)
RDW: 14.7 % — AB (ref 11.5–14.5)
WBC: 10.7 10*3/uL — AB (ref 3.8–10.6)

## 2015-11-07 LAB — LIPASE, BLOOD: LIPASE: 23 U/L (ref 11–51)

## 2015-11-07 NOTE — ED Notes (Signed)
C/o nausea, abdominal pain, decreased appetite dizziness x 3-4 months.  Seen by other providers for same complaint.  States "We just want a blood test so we can take it to GI doctor".  Symptoms from wife, reading off of a list.  Patient provides a list of symptoms that includes bloated stomach, dizzy, sick stomach, not hungry, not wanting to dring, heart burn, r inging in ears, and sadness.

## 2015-11-07 NOTE — ED Notes (Signed)
Lab states there was not enough urine to complete sample. Will try to recollect.

## 2017-03-01 IMAGING — CT CT HEAD W/O CM
1 series · 16 of 30 positions shown, 20 images · non-contrast
Comparison: None.

CLINICAL DATA: Difficulty speaking

EXAM:
CT HEAD WITHOUT CONTRAST
TECHNIQUE: Contiguous axial images were obtained from the base of the skull
through the vertex without intravenous contrast.

[Series 2: head 4.8 h37s · axial · 0.44mm/px · z∈[-198,-65]mm · 16 of 32 slices shown, 20 images]
[im 2/32  brain]
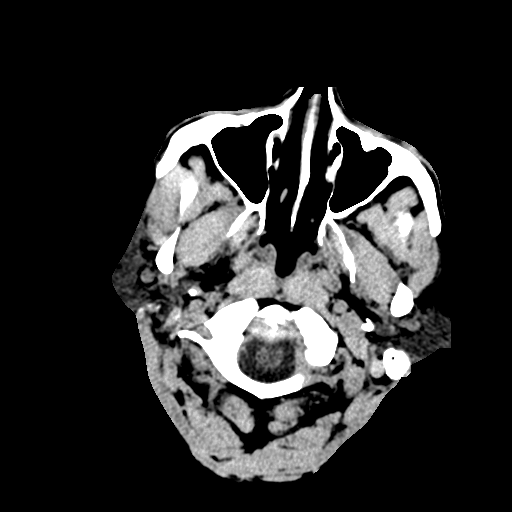
[im 2/32  bone]
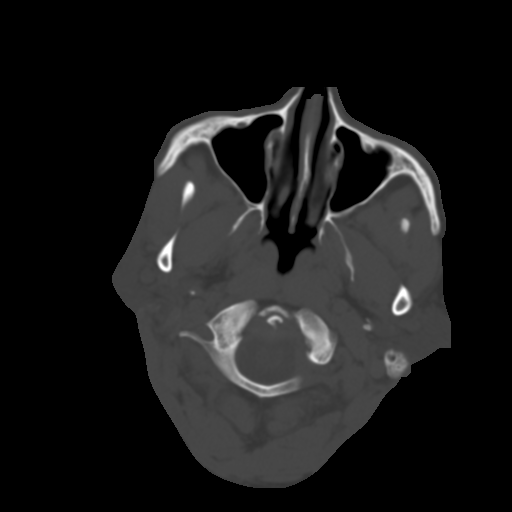
[im 4/32  brain]
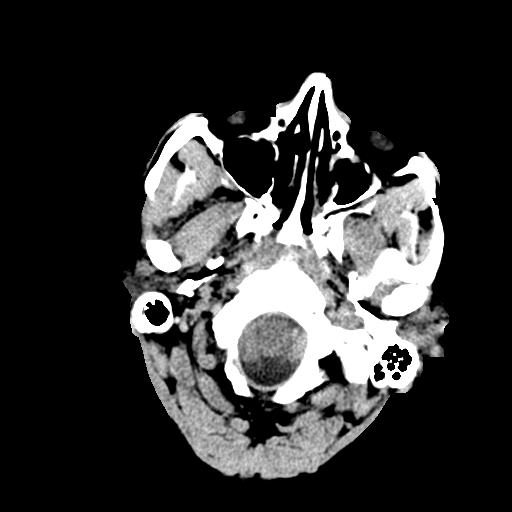
[im 6/32  brain]
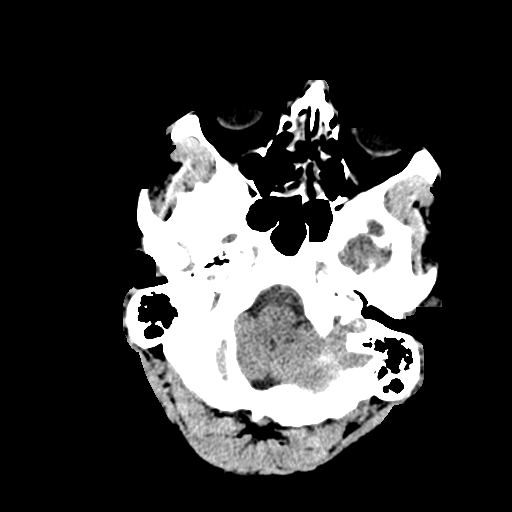
[im 8/32  brain]
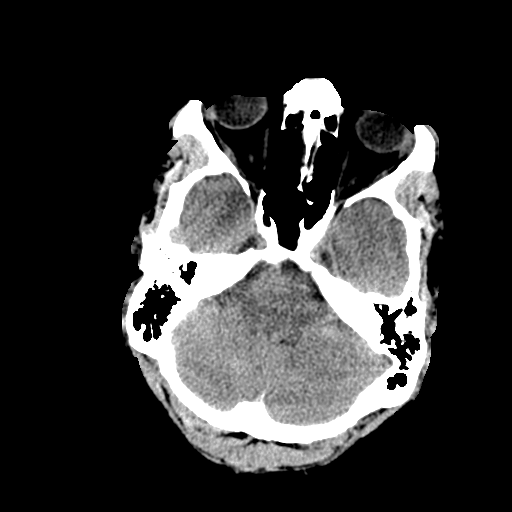
[im 9/32  brain]
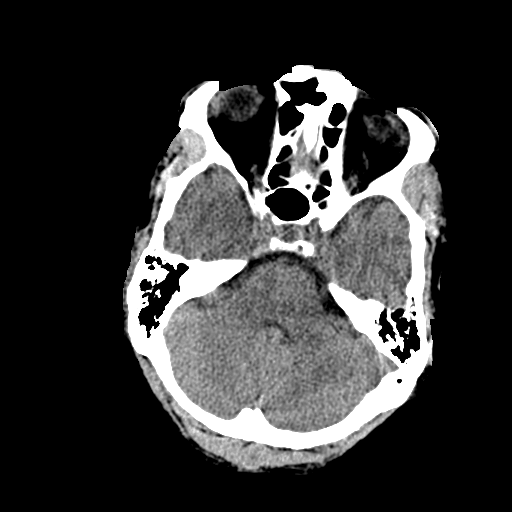
[im 9/32  bone]
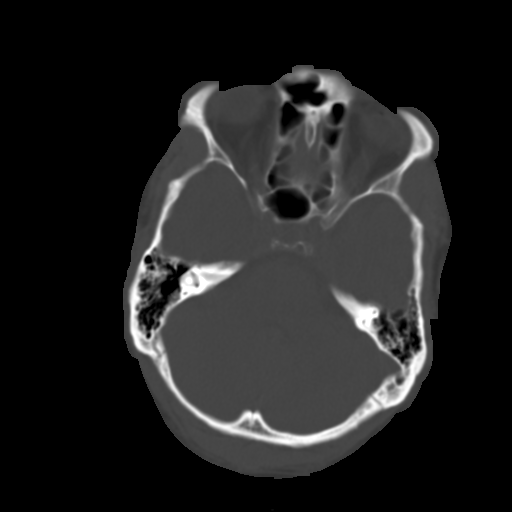
[im 11/32  brain]
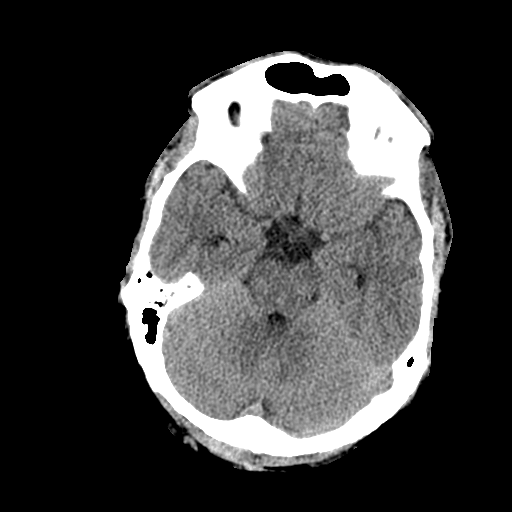
[im 13/32  brain]
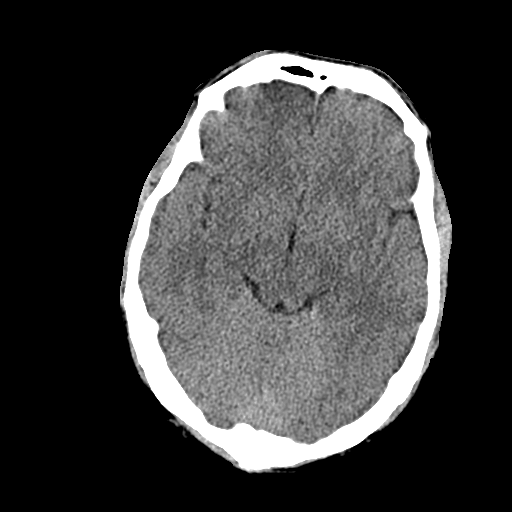
[im 15/32  brain]
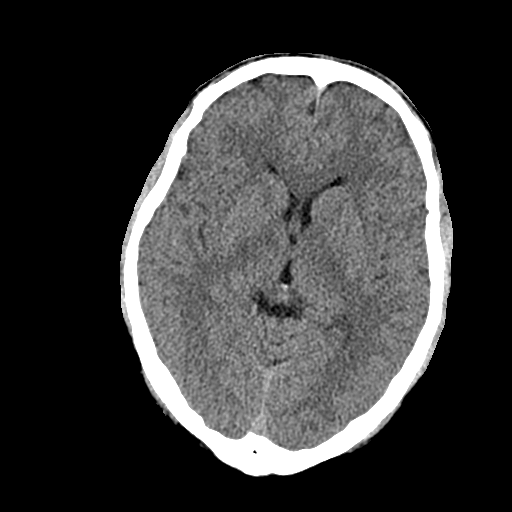
[im 17/32  brain]
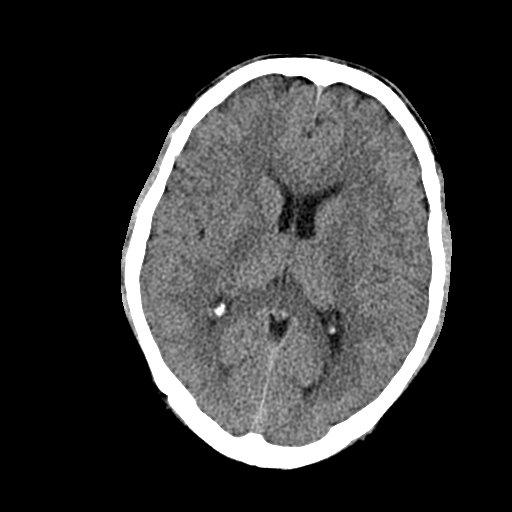
[im 17/32  bone]
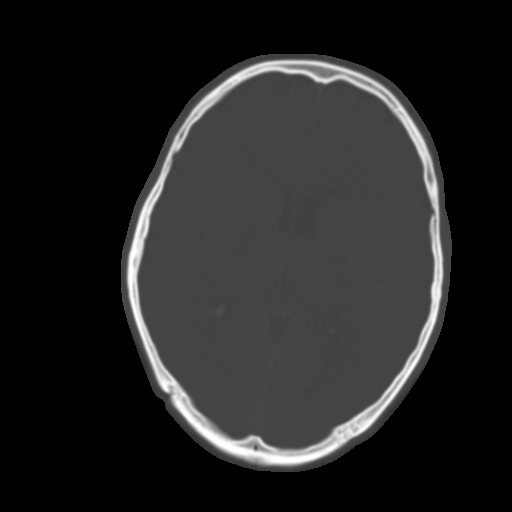
[im 19/32  brain]
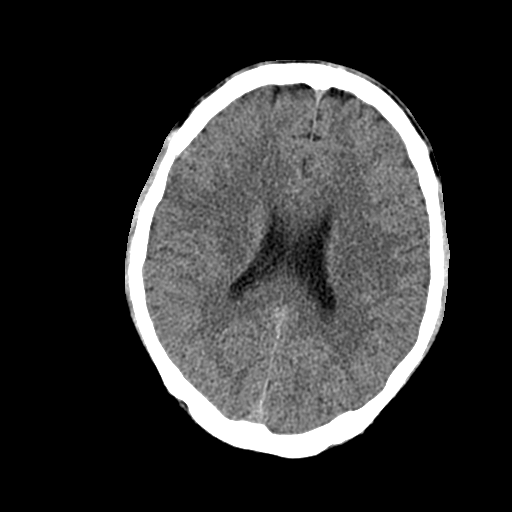
[im 21/32  brain]
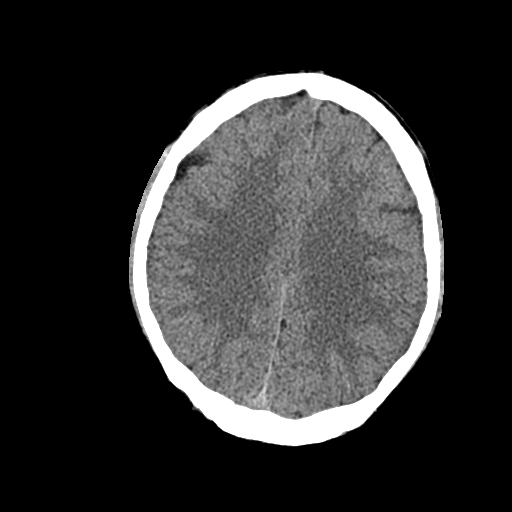
[im 23/32  brain]
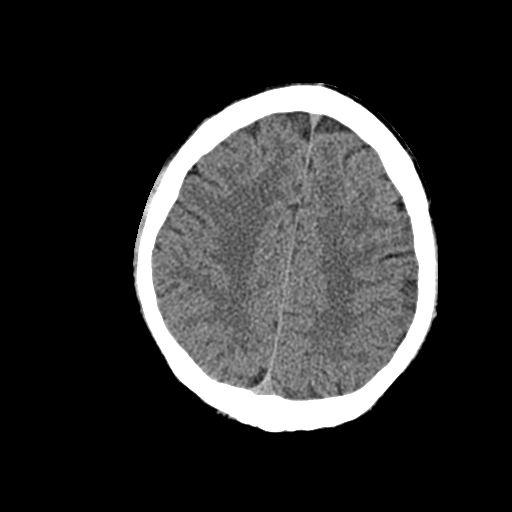
[im 24/32  brain]
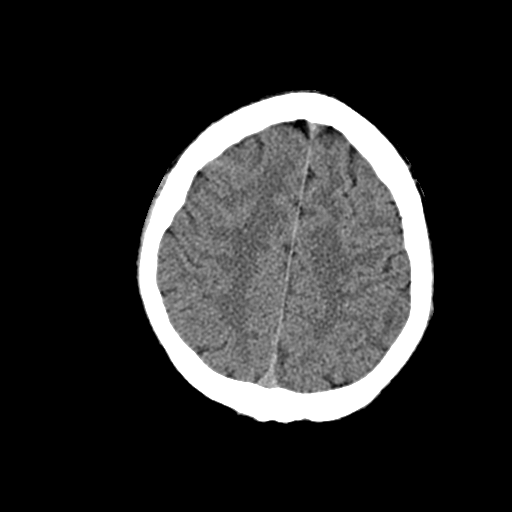
[im 24/32  bone]
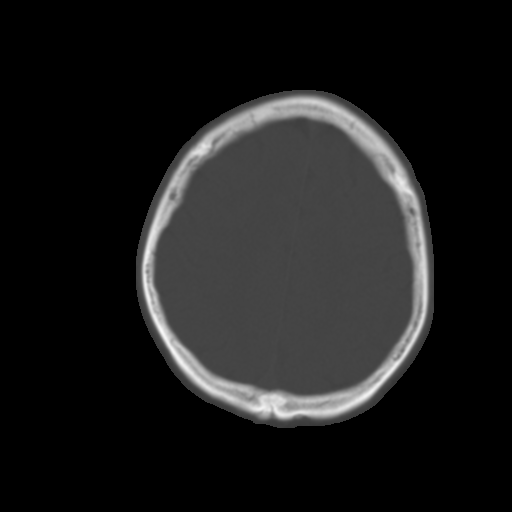
[im 26/32  brain]
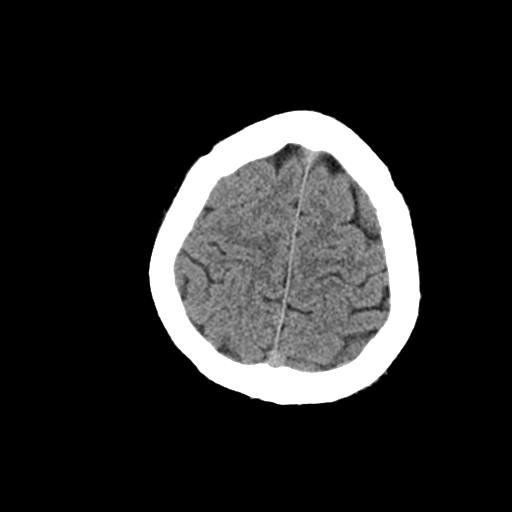
[im 28/32  brain]
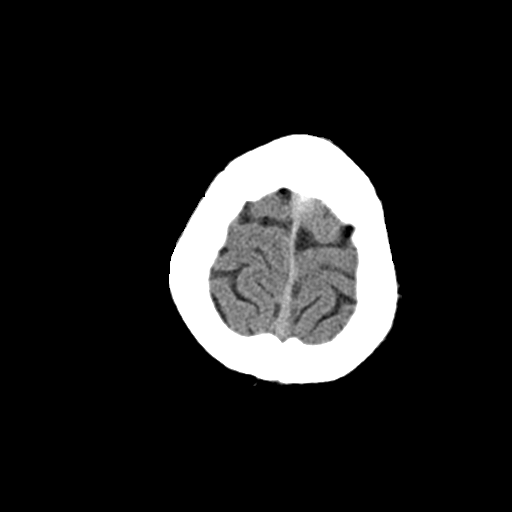
[im 30/32  brain]
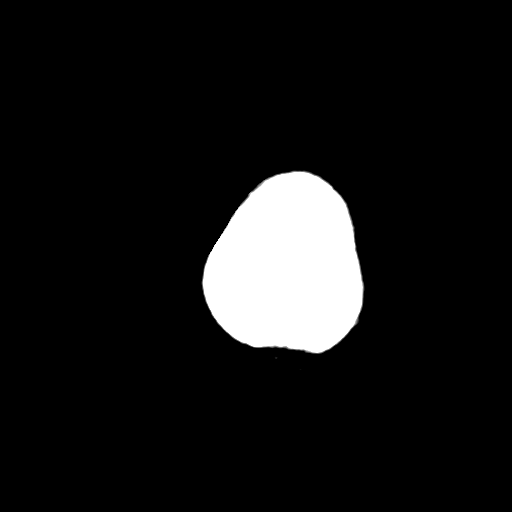

[16 of 30 positions shown; findings below may reference images not displayed]

FINDINGS: No evidence of parenchymal hemorrhage or extra-axial fluid
collection. No mass lesion, mass effect, or midline shift.

No CT evidence of acute infarction.

Cerebral volume is within normal limits.  No ventriculomegaly.

Partial opacification of the visualized ethmoid sinuses. The mastoid
air cells are unopacified.

No evidence of calvarial fracture.
IMPRESSION: Normal head CT.

## 2017-04-10 ENCOUNTER — Emergency Department (HOSPITAL_BASED_OUTPATIENT_CLINIC_OR_DEPARTMENT_OTHER): Payer: Self-pay

## 2017-04-10 ENCOUNTER — Emergency Department (HOSPITAL_BASED_OUTPATIENT_CLINIC_OR_DEPARTMENT_OTHER)
Admission: EM | Admit: 2017-04-10 | Discharge: 2017-04-10 | Disposition: A | Discharge status: Home / Self Care | Payer: Self-pay | Attending: Emergency Medicine | Admitting: Emergency Medicine

## 2017-04-10 ENCOUNTER — Encounter (HOSPITAL_BASED_OUTPATIENT_CLINIC_OR_DEPARTMENT_OTHER): Payer: Self-pay | Admitting: *Deleted

## 2017-04-10 DIAGNOSIS — I10 Essential (primary) hypertension: Secondary | ICD-10-CM | POA: Insufficient documentation

## 2017-04-10 DIAGNOSIS — F17200 Nicotine dependence, unspecified, uncomplicated: Secondary | ICD-10-CM | POA: Insufficient documentation

## 2017-04-10 DIAGNOSIS — K5900 Constipation, unspecified: Secondary | ICD-10-CM | POA: Insufficient documentation

## 2017-04-10 DIAGNOSIS — Z79899 Other long term (current) drug therapy: Secondary | ICD-10-CM | POA: Insufficient documentation

## 2017-04-10 DIAGNOSIS — R1010 Upper abdominal pain, unspecified: Secondary | ICD-10-CM | POA: Insufficient documentation

## 2017-04-10 LAB — CBC
HCT: 41.2 % (ref 39.0–52.0)
Hemoglobin: 14.6 g/dL (ref 13.0–17.0)
MCH: 31 pg (ref 26.0–34.0)
MCHC: 35.4 g/dL (ref 30.0–36.0)
MCV: 87.5 fL (ref 78.0–100.0)
PLATELETS: 190 10*3/uL (ref 150–400)
RBC: 4.71 MIL/uL (ref 4.22–5.81)
RDW: 13.9 % (ref 11.5–15.5)
WBC: 6.1 10*3/uL (ref 4.0–10.5)

## 2017-04-10 LAB — COMPREHENSIVE METABOLIC PANEL
ALT: 20 U/L (ref 17–63)
ANION GAP: 10 (ref 5–15)
AST: 25 U/L (ref 15–41)
Albumin: 4 g/dL (ref 3.5–5.0)
Alkaline Phosphatase: 106 U/L (ref 38–126)
BUN: 15 mg/dL (ref 6–20)
CHLORIDE: 106 mmol/L (ref 101–111)
CO2: 23 mmol/L (ref 22–32)
CREATININE: 0.73 mg/dL (ref 0.61–1.24)
Calcium: 8.9 mg/dL (ref 8.9–10.3)
Glucose, Bld: 117 mg/dL — ABNORMAL HIGH (ref 65–99)
Potassium: 3.4 mmol/L — ABNORMAL LOW (ref 3.5–5.1)
SODIUM: 139 mmol/L (ref 135–145)
Total Bilirubin: 0.5 mg/dL (ref 0.3–1.2)
Total Protein: 6.5 g/dL (ref 6.5–8.1)

## 2017-04-10 LAB — LIPASE, BLOOD: Lipase: 29 U/L (ref 11–51)

## 2017-04-10 MED ORDER — ACETAMINOPHEN 500 MG PO TABS: 500.0000 mg | ORAL_TABLET | Freq: Four times a day (QID) | ORAL | 0 refills | Status: AC | PRN

## 2017-04-10 MED ORDER — OMEPRAZOLE 20 MG PO CPDR: 20.0000 mg | DELAYED_RELEASE_CAPSULE | Freq: Every day | ORAL | 0 refills | Status: AC

## 2017-04-10 NOTE — ED Triage Notes (Signed)
Pt here with family. Family answering questions for patient. Vague non-specific c/o abd pain, rash, vomited x1.   Alert, NAD, calm, interactive, resps e/u, speaking clearly, communication sparse, no dyspnea noted, skin W&D, rash not visualized.

## 2017-04-10 NOTE — ED Notes (Signed)
MD at BS

## 2017-04-10 NOTE — ED Notes (Signed)
Dr. Campos into room 

## 2017-04-10 NOTE — ED Provider Notes (Signed)
[facility]   CSN: [id number] Arrival date & time: 04/10/17  0331     History   Chief Complaint Chief Complaint  Patient presents with  . Abdominal Pain    HPI Miguel Contreras is a 46 y.o. male.  HPI Patient reports upper abdominal pain over the past 4-5 hours.  He denies back pain.  No radiation of his pain.  He has had pain intermittently act this before.  He denies alcohol use.  No history of her pancreatitis.  Never been told he had gallstones.  Denies fevers and chills.  Reports nausea and reports one episode of nonbloody nonbilious vomiting.  Denies diarrhea.  Reports mild constipation over the past several weeks despite milk of magnesia.   Past Medical History:  Diagnosis Date  . Depression   . Helicobacter positive gastritis   . Hypertension     There are no active problems to display for this patient.   History reviewed. No pertinent surgical history.     Home Medications    Prior to Admission medications   Medication Sig Start Date End Date Taking? Authorizing Provider  acetaminophen (TYLENOL) 500 MG tablet Take 1 tablet (500 mg total) by mouth every 6 (six) hours as needed. 04/10/17   Azalia Bilis, MD  amitriptyline (ELAVIL) 25 MG tablet Take 1 tablet (25 mg total) by mouth at bedtime. 07/26/15   Gilda Crease, MD  amphetamine-dextroamphetamine (ADDERALL) 10 MG tablet Take 10 mg by mouth daily with breakfast.    [provider]  omeprazole (PRILOSEC) 20 MG capsule Take 1 capsule (20 mg total) by mouth daily. 04/10/17   Azalia Bilis, MD    Family History History reviewed. No pertinent family history.  Social History Social History  Substance Use Topics  . Smoking status: Current Every Day Smoker    Packs/day: 1.00  . Smokeless tobacco: Never Used  . Alcohol use No     Allergies   Patient has no known allergies.   Review of Systems Review of Systems  All other systems reviewed and are  negative.    Physical Exam Updated Vital Signs BP (!) 142/94 (BP Location: Right Arm)   Pulse 90   Temp 98.7 F (37.1 C) (Oral)   Resp 16   Ht 5\' 10"  (1.778 m)   Wt 79.4 kg (175 lb)   SpO2 95%   BMI 25.11 kg/m   Physical Exam  Constitutional: He is oriented to person, place, and time. He appears well-developed and well-nourished.  HENT:  Head: Normocephalic and atraumatic.  Eyes: EOM are normal.  Neck: Normal range of motion.  Cardiovascular: Normal rate, regular rhythm, normal heart sounds and intact distal pulses.   Pulmonary/Chest: Effort normal and breath sounds normal. No respiratory distress.  Abdominal: Soft. He exhibits no distension. There is no tenderness.  Musculoskeletal: Normal range of motion.  Neurological: He is alert and oriented to person, place, and time.  Skin: Skin is warm and dry.  Psychiatric: He has a normal mood and affect. Judgment normal.  Nursing note and vitals reviewed.    ED Treatments / Results  Labs (all labs ordered are listed, but only abnormal results are displayed) Labs Reviewed  COMPREHENSIVE METABOLIC PANEL - Abnormal; Notable for the following:       Result Value   Potassium 3.4 (*)    Glucose, Bld 117 (*)    All other components within normal limits  CBC  LIPASE, BLOOD    EKG  EKG  Interpretation None       Radiology Dg Abd 2 Views  Result Date: 04/10/2017 CLINICAL DATA:  46 year old male with abdominal pain. EXAM: ABDOMEN - 2 VIEW COMPARISON:  Abdominal radiograph dated 01/07/2017 FINDINGS: The bowel gas pattern is normal. There is no evidence of free air. No radio-opaque calculi or other significant radiographic abnormality is seen. IMPRESSION: Negative. Electronically Signed   By: Elgie CollardArash  Radparvar M.D.   On: 04/10/2017 04:52    Procedures Procedures (including critical care time)  Medications Ordered in ED Medications - No data to display   Initial Impression / Assessment and Plan / ED Course  I have  reviewed the triage vital signs and the nursing notes.  Pertinent labs & imaging results that were available during my care of the patient were reviewed by me and considered in my medical decision making (see chart for details).     Without treatment here in the emergency department the patient reports that he feels much better.  Discharge home in good condition.  May represent gastritis.  Outpatient primary care referral.  He understands to return to the ER for new or worsening symptoms.  Recommended Prilosec.  He also wanted to know what medication to take for occasional back pain.  I recommended Tylenol and written a prescription for it so that he would better understand the dose  Final Clinical Impressions(s) / ED Diagnoses   Final diagnoses:  Upper abdominal pain  Constipation    New Prescriptions New Prescriptions   ACETAMINOPHEN (TYLENOL) 500 MG TABLET    Take 1 tablet (500 mg total) by mouth every 6 (six) hours as needed.   OMEPRAZOLE (PRILOSEC) 20 MG CAPSULE    Take 1 capsule (20 mg total) by mouth daily.     Azalia Bilisampos, Sunday Klos, MD 04/10/17 [phone]

## 2017-04-12 ENCOUNTER — Emergency Department (HOSPITAL_COMMUNITY)
Admission: EM | Admit: 2017-04-12 | Discharge: 2017-04-13 | Disposition: A | Discharge status: Home / Self Care | Payer: Self-pay | Attending: Emergency Medicine | Admitting: Emergency Medicine

## 2017-04-12 DIAGNOSIS — Z5321 Procedure and treatment not carried out due to patient leaving prior to being seen by health care provider: Secondary | ICD-10-CM | POA: Insufficient documentation

## 2017-04-12 DIAGNOSIS — M542 Cervicalgia: Secondary | ICD-10-CM | POA: Insufficient documentation

## 2017-04-12 NOTE — ED Notes (Signed)
Pt called from lobby with no response x2 

## 2017-04-12 NOTE — ED Notes (Signed)
Called to take to room  No response from lobby  

## 2017-04-12 NOTE — ED Triage Notes (Addendum)
Pt here with family. Pt and family refusing to answer questions and state they only want to speak with a doctor. Pt states he already told his symptoms to registration and there is no reason to be assessed by RN. CC while checking in with registration is emesis, fever, and neck pain. Family states they think it is meningitis. Pt is not febrile nor tachycardic. [facility]. Pt was moving his head from side to side while he was in triage room

## 2017-04-16 ENCOUNTER — Emergency Department (HOSPITAL_COMMUNITY)
Admission: EM | Admit: 2017-04-16 | Discharge: 2017-04-16 | Disposition: A | Discharge status: Home / Self Care | Payer: Self-pay | Attending: Emergency Medicine | Admitting: Emergency Medicine

## 2017-04-16 ENCOUNTER — Encounter (HOSPITAL_COMMUNITY): Payer: Self-pay | Admitting: Emergency Medicine

## 2017-04-16 DIAGNOSIS — R462 Strange and inexplicable behavior: Secondary | ICD-10-CM | POA: Insufficient documentation

## 2017-04-16 DIAGNOSIS — F17200 Nicotine dependence, unspecified, uncomplicated: Secondary | ICD-10-CM | POA: Insufficient documentation

## 2017-04-16 DIAGNOSIS — I10 Essential (primary) hypertension: Secondary | ICD-10-CM | POA: Insufficient documentation

## 2017-04-16 DIAGNOSIS — Z79899 Other long term (current) drug therapy: Secondary | ICD-10-CM | POA: Insufficient documentation

## 2017-04-16 LAB — COMPREHENSIVE METABOLIC PANEL
ALBUMIN: 3.7 g/dL (ref 3.5–5.0)
ALT: 18 U/L (ref 17–63)
AST: 21 U/L (ref 15–41)
Alkaline Phosphatase: 118 U/L (ref 38–126)
Anion gap: 7 (ref 5–15)
BUN: 14 mg/dL (ref 6–20)
CHLORIDE: 110 mmol/L (ref 101–111)
CO2: 22 mmol/L (ref 22–32)
CREATININE: 0.88 mg/dL (ref 0.61–1.24)
Calcium: 8.9 mg/dL (ref 8.9–10.3)
GFR calc non Af Amer: >60 mL/min (ref >60)
GLUCOSE: 100 mg/dL — AB (ref 65–99)
Potassium: 3.7 mmol/L (ref 3.5–5.1)
SODIUM: 139 mmol/L (ref 135–145)
Total Bilirubin: 0.7 mg/dL (ref 0.3–1.2)
Total Protein: 5.8 g/dL — ABNORMAL LOW (ref 6.5–8.1)

## 2017-04-16 LAB — CBC WITH DIFFERENTIAL/PLATELET
BASOS ABS: 0 10*3/uL (ref 0.0–0.1)
Basophils Relative: 0 %
EOS ABS: 0.2 10*3/uL (ref 0.0–0.7)
Eosinophils Relative: 3 %
HCT: 42.7 % (ref 39.0–52.0)
Hemoglobin: 14.4 g/dL (ref 13.0–17.0)
Lymphocytes Relative: 25 %
Lymphs Abs: 2 10*3/uL (ref 0.7–4.0)
MCH: 30 pg (ref 26.0–34.0)
MCHC: 33.7 g/dL (ref 30.0–36.0)
MCV: 89 fL (ref 78.0–100.0)
Monocytes Absolute: 0.8 10*3/uL (ref 0.1–1.0)
Monocytes Relative: 10 %
NEUTROS PCT: 62 %
Neutro Abs: 5 10*3/uL (ref 1.7–7.7)
PLATELETS: 213 10*3/uL (ref 150–400)
RBC: 4.8 MIL/uL (ref 4.22–5.81)
RDW: 13.8 % (ref 11.5–15.5)
WBC: 7.9 10*3/uL (ref 4.0–10.5)

## 2017-04-16 LAB — T4, FREE: Free T4: 0.98 ng/dL (ref 0.61–1.12)

## 2017-04-16 LAB — ETHANOL: Alcohol, Ethyl (B): <5 mg/dL (ref <5)

## 2017-04-16 LAB — TSH: TSH: 1.135 u[IU]/mL (ref 0.350–4.500)

## 2017-04-16 NOTE — ED Notes (Signed)
Pt is aware that urine is needed for sample, urinal at bedside. 

## 2017-04-16 NOTE — ED Notes (Signed)
Pt wife requesting update, informed her that pt needs to provide a urine sample but blood work that has resulted so far is reassuring. Pt SO has her phone pulled up with information about bacterial meningitis and states that if we do not test pt for this they will be leaving bc they are swearing up and down pt has bacterial meningitis. On way out pt and her daughter told this RN to "suck it"

## 2017-04-16 NOTE — ED Provider Notes (Signed)
[facility] Provider Note   CSN: [id number] Arrival date & time: 04/16/17  1500     History   Chief Complaint Chief Complaint  Patient presents with  . Neck Pain    HPI Miguel Contreras is a 46 y.o. male.  HPI Patient presents with family members are concerned about increasingly bizarre behavior. States patient has been wandering and is unfamiliar with endings. He's had increasingly delusional behavior. Patient states he has not slept for 4 days. He is having racing thoughts. States he's had episodic posterior neck pain but denies any currently. Denies any neck stiffness, headache, fever or chills. Denies taking any medication. Patient states he had one and a half beers yesterday but does not routinely drink alcohol. Denies illegal drugs.   Past Medical History:  Diagnosis Date  . Depression   . Helicobacter positive gastritis   . Hypertension     There are no active problems to display for this patient.   History reviewed. No pertinent surgical history.     Home Medications    Prior to Admission medications   Medication Sig Start Date End Date Taking? Authorizing Provider  acetaminophen (TYLENOL) 500 MG tablet Take 1 tablet (500 mg total) by mouth every 6 (six) hours as needed. 04/10/17   Azalia Bilis, MD  amitriptyline (ELAVIL) 25 MG tablet Take 1 tablet (25 mg total) by mouth at bedtime. 07/26/15   Gilda Crease, MD  amphetamine-dextroamphetamine (ADDERALL) 10 MG tablet Take 10 mg by mouth daily with breakfast.    [provider]  omeprazole (PRILOSEC) 20 MG capsule Take 1 capsule (20 mg total) by mouth daily. 04/10/17   Azalia Bilis, MD    Family History History reviewed. No pertinent family history.  Social History Social History  Substance Use Topics  . Smoking status: Current Every Day Smoker    Packs/day: 1.00  . Smokeless tobacco: Never Used  . Alcohol use No     Allergies   Patient has no known allergies.   Review of  Systems Review of Systems  Constitutional: Negative for chills and fever.  HENT: Negative for sore throat and trouble swallowing.   Eyes: Negative for photophobia and visual disturbance.  Respiratory: Negative for cough and shortness of breath.   Cardiovascular: Negative for chest pain, palpitations and leg swelling.  Gastrointestinal: Negative for abdominal pain, constipation, diarrhea and nausea.  Genitourinary: Negative for dysuria, flank pain and frequency.  Musculoskeletal: Positive for neck pain. Negative for back pain, gait problem, myalgias and neck stiffness.  Skin: Negative for rash and wound.  Neurological: Negative for dizziness, light-headedness, numbness and headaches.  Psychiatric/Behavioral: Positive for agitation, behavioral problems and sleep disturbance. Negative for hallucinations and suicidal ideas. The patient is nervous/anxious and is hyperactive.   All other systems reviewed and are negative.    Physical Exam Updated Vital Signs BP (!) 131/98 (BP Location: Right Arm)   Pulse 95   Temp 97.7 F (36.5 C) (Oral)   Resp 18   SpO2 97%   Physical Exam  Constitutional: He is oriented to person, place, and time. He appears well-developed and well-nourished.  HENT:  Head: Normocephalic and atraumatic.  Mouth/Throat: Oropharynx is clear and moist. No oropharyngeal exudate.  Eyes: Pupils are equal, round, and reactive to light. EOM are normal.  Pupils dilated bilaterally but reactive. No nystagmus.  Neck: Normal range of motion. Neck supple. No thyromegaly present.  No posterior midline cervical tenderness to palpation. No anterior lymphadenopathy. No meningismus.  Cardiovascular: Normal rate  and regular rhythm.  Exam reveals no gallop and no friction rub.   No murmur heard. Pulmonary/Chest: Effort normal and breath sounds normal. No respiratory distress. He has no wheezes. He has no rales. He exhibits no tenderness.  Abdominal: Soft. Bowel sounds are normal. There  is no tenderness. There is no rebound and no guarding.  Musculoskeletal: Normal range of motion. He exhibits no edema or tenderness.  No thoracic or lumbar tenderness to palpation. No lower extremity swelling, asymmetry or tenderness. Distal pulses are 2+.  Neurological: He is alert and oriented to person, place, and time.  Patient is alert and oriented x3 with clea. Patient has 5/5 motor in all extremities. Sensation is intact to light touch. Bilateral finger-to-nose is normal with no signs of dysmetria. Patient has a normal gait and walks without assistance.  Skin: Skin is warm and dry. Capillary refill takes less than 2 seconds. No rash noted. No erythema.  Psychiatric:  Pressure speech. Hyperactive. Denies SI/HI.  Nursing note and vitals reviewed.    ED Treatments / Results  Labs (all labs ordered are listed, but only abnormal results are displayed) Labs Reviewed  COMPREHENSIVE METABOLIC PANEL - Abnormal; Notable for the following:       Result Value   Glucose, Bld 100 (*)    Total Protein 5.8 (*)    All other components within normal limits  CBC WITH DIFFERENTIAL/PLATELET  ETHANOL  TSH  T4, FREE    EKG  EKG Interpretation  Date/Time:  Sunday April 16 2017 16:44:09 EDT Ventricular Rate:  83 PR Interval:    QRS Duration: 87 QT Interval:  371 QTC Calculation: 436 R Axis:   79 Text Interpretation:  Sinus rhythm Confirmed by Rolland PorterJames, Mark ([id number]) on 04/17/2017 8:45:05 PM       Radiology No results found.  Procedures Procedures (including critical care time)  Medications Ordered in ED Medications - No data to display   Initial Impression / Assessment and Plan / ED Course  I have reviewed the triage vital signs and the nursing notes.  Pertinent labs & imaging results that were available during my care of the patient were reviewed by me and considered in my medical decision making (see chart for details).   Patient appears to be acutely manic. We will screen  medically. Low suspicion for infectious process specifically meningitis.  Patient and family left AMA prior to completion of workup. No return precautions and discharge instructions given.  Final Clinical Impressions(s) / ED Diagnoses   Final diagnoses:  Bizarre behavior    New Prescriptions Discharge Medication List as of 04/16/2017  6:15 PM       Loren RacerYelverton, Victorine Mcnee, MD 04/19/17 2012

## 2017-04-16 NOTE — ED Triage Notes (Signed)
Pt here with family acting strangely and irritated; pt family sts "he has meningitis and so does my whole family"; unable to provide any other details

## 2017-05-12 ENCOUNTER — Encounter (HOSPITAL_COMMUNITY): Payer: Self-pay | Admitting: Emergency Medicine

## 2017-05-12 DIAGNOSIS — Z5321 Procedure and treatment not carried out due to patient leaving prior to being seen by health care provider: Secondary | ICD-10-CM | POA: Insufficient documentation

## 2017-05-12 NOTE — ED Triage Notes (Signed)
Pt comes in with wife and children with complaints of neck pain.  Pt and wife state they have had meningitis in their family and are having the symptoms.  Pt also states that he feels like he has things crawling in him and he saw something that looked like it was being shot from his window and he felt it go in his neck and he feels like there is something there. Hx of psychiatric illness.  Has been seen for hallucinations in the past.

## 2017-05-13 ENCOUNTER — Emergency Department (HOSPITAL_COMMUNITY)
Admission: EM | Admit: 2017-05-13 | Discharge: 2017-05-13 | Discharge status: Against Medical Advice | Payer: Self-pay | Attending: Emergency Medicine | Admitting: Emergency Medicine

## 2017-05-13 HISTORY — DX: Hallucinations, unspecified: R44.3

## 2017-05-13 NOTE — ED Notes (Signed)
Patient was not in room when nurse went in to check on patient.

## 2017-05-13 NOTE — ED Notes (Signed)
Triage RN went to pt room and he had walked out.

## 2018-06-06 ENCOUNTER — Encounter (HOSPITAL_COMMUNITY): Payer: Self-pay | Admitting: Emergency Medicine

## 2018-06-06 ENCOUNTER — Other Ambulatory Visit: Payer: Self-pay

## 2018-06-06 ENCOUNTER — Emergency Department (HOSPITAL_COMMUNITY)
Admission: EM | Admit: 2018-06-06 | Discharge: 2018-06-06 | Disposition: A | Discharge status: Home / Self Care | Payer: Self-pay | Attending: Emergency Medicine | Admitting: Emergency Medicine

## 2018-06-06 DIAGNOSIS — Z5900 Homelessness unspecified: Secondary | ICD-10-CM

## 2018-06-06 DIAGNOSIS — R443 Hallucinations, unspecified: Secondary | ICD-10-CM

## 2018-06-06 DIAGNOSIS — R441 Visual hallucinations: Secondary | ICD-10-CM | POA: Insufficient documentation

## 2018-06-06 DIAGNOSIS — F17210 Nicotine dependence, cigarettes, uncomplicated: Secondary | ICD-10-CM | POA: Insufficient documentation

## 2018-06-06 DIAGNOSIS — I10 Essential (primary) hypertension: Secondary | ICD-10-CM | POA: Insufficient documentation

## 2018-06-06 DIAGNOSIS — F1520 Other stimulant dependence, uncomplicated: Secondary | ICD-10-CM | POA: Insufficient documentation

## 2018-06-06 DIAGNOSIS — R462 Strange and inexplicable behavior: Secondary | ICD-10-CM

## 2018-06-06 DIAGNOSIS — Z79899 Other long term (current) drug therapy: Secondary | ICD-10-CM | POA: Insufficient documentation

## 2018-06-06 LAB — CBC WITH DIFFERENTIAL/PLATELET
BASOS ABS: 0 10*3/uL (ref 0.0–0.1)
Basophils Relative: 0 %
Eosinophils Absolute: 0.1 10*3/uL (ref 0.0–0.7)
Eosinophils Relative: 1 %
HEMATOCRIT: 43.1 % (ref 39.0–52.0)
Hemoglobin: 14.9 g/dL (ref 13.0–17.0)
LYMPHS PCT: 25 %
Lymphs Abs: 2 10*3/uL (ref 0.7–4.0)
MCH: 30.8 pg (ref 26.0–34.0)
MCHC: 34.6 g/dL (ref 30.0–36.0)
MCV: 89.2 fL (ref 78.0–100.0)
Monocytes Absolute: 0.7 10*3/uL (ref 0.1–1.0)
Monocytes Relative: 9 %
NEUTROS ABS: 5.1 10*3/uL (ref 1.7–7.7)
NEUTROS PCT: 65 %
PLATELETS: 201 10*3/uL (ref 150–400)
RBC: 4.83 MIL/uL (ref 4.22–5.81)
RDW: 13.2 % (ref 11.5–15.5)
WBC: 7.8 10*3/uL (ref 4.0–10.5)

## 2018-06-06 LAB — URINALYSIS, ROUTINE W REFLEX MICROSCOPIC
BILIRUBIN URINE: NEGATIVE
GLUCOSE, UA: NEGATIVE mg/dL
Hgb urine dipstick: NEGATIVE
Ketones, ur: 5 mg/dL — AB
LEUKOCYTES UA: NEGATIVE
NITRITE: NEGATIVE
PH: 6 (ref 5.0–8.0)
Protein, ur: 30 mg/dL — AB
SPECIFIC GRAVITY, URINE: 1.033 — AB (ref 1.005–1.030)

## 2018-06-06 LAB — COMPREHENSIVE METABOLIC PANEL
ALT: 19 U/L (ref 0–44)
ANION GAP: 10 (ref 5–15)
AST: 22 U/L (ref 15–41)
Albumin: 4.3 g/dL (ref 3.5–5.0)
Alkaline Phosphatase: 106 U/L (ref 38–126)
BUN: 17 mg/dL (ref 6–20)
CHLORIDE: 108 mmol/L (ref 98–111)
CO2: 28 mmol/L (ref 22–32)
Calcium: 9 mg/dL (ref 8.9–10.3)
Creatinine, Ser: 0.85 mg/dL (ref 0.61–1.24)
GFR calc non Af Amer: >60 mL/min (ref >60)
Glucose, Bld: 92 mg/dL (ref 70–99)
POTASSIUM: 3.8 mmol/L (ref 3.5–5.1)
Sodium: 146 mmol/L — ABNORMAL HIGH (ref 135–145)
Total Bilirubin: 2 mg/dL — ABNORMAL HIGH (ref 0.3–1.2)
Total Protein: 6.8 g/dL (ref 6.5–8.1)

## 2018-06-06 LAB — ACETAMINOPHEN LEVEL

## 2018-06-06 LAB — RAPID URINE DRUG SCREEN, HOSP PERFORMED
Amphetamines: POSITIVE — AB
BARBITURATES: NOT DETECTED
Benzodiazepines: NOT DETECTED
Cocaine: NOT DETECTED
Opiates: NOT DETECTED
TETRAHYDROCANNABINOL: NOT DETECTED

## 2018-06-06 LAB — SALICYLATE LEVEL: Salicylate Lvl: <7 mg/dL (ref 2.8–30.0)

## 2018-06-06 LAB — ETHANOL: Alcohol, Ethyl (B): <10 mg/dL (ref <10)

## 2018-06-06 NOTE — ED Triage Notes (Signed)
Pt here in police custody with "bizarre behavior". Was swatting at air and has rapid irregular eye movement. Vocalizing auditory and tactile hallucinations.

## 2018-06-06 NOTE — ED Notes (Signed)
Pt taking fluids well without N/V. AO now

## 2018-06-06 NOTE — Discharge Instructions (Signed)
Psych determined you were psychiatrically cleared.   Your lab work was overall normal.  No further medical needs.  You are medically cleared.   See resources attached.  Return for thoughts of suicide or homicide, fever, severe headache, chest pain or shortness of breath with activity or exertion

## 2018-06-06 NOTE — ED Provider Notes (Signed)
[facility]-EMERGENCY DEPT Provider Note   CSN: [id number] Arrival date & time: 06/06/18  1239     History   Chief Complaint Chief Complaint  Patient presents with  . Medical Clearance    HPI Miguel Contreras is a 47 y.o. male with history of documented schizophrenia is brought to the ER by [employer] for evaluation of bizarre behavior.  History is obtained directly from patient and by police officers at bedside.  Police was called for bizarre behavior, when they arrived patient was punching at the air.  EMS tried to clear him at the scene but patient was disoriented.  Please officer states patient is under arrest, but they would like him to be medically and psychiatrically cleared before taking him in.  Patient is alert to self only.  He tells me he is sad.  He is having tactile hallucinations that he describes as "ants crawling" as well as intermittent Lee hearing voices that insult him.  States that this has been ongoing for several years.  States that him and his wife are living in a hotel but yesterday they separated and now patient is homeless.  He denies thoughts of suicide, thoughts of homicide or hurting other people, visual hallucinations.  He denies EtOH or illicit drug use.  He does not take any medications.  He denies any psychiatric illness in the past. He has no pain. States "I am fine", "let them take me to jail".   HPI  Past Medical History:  Diagnosis Date  . Depression   . Hallucinations   . Helicobacter positive gastritis   . Hypertension     There are no active problems to display for this patient.   History reviewed. No pertinent surgical history.      Home Medications    Prior to Admission medications   Medication Sig Start Date End Date Taking? Authorizing Provider  acetaminophen (TYLENOL) 500 MG tablet Take 1 tablet (500 mg total) by mouth every 6 (six) hours as needed. Patient not taking: Reported on 06/06/2018 04/10/17   Azalia Bilis,  MD  amitriptyline (ELAVIL) 25 MG tablet Take 1 tablet (25 mg total) by mouth at bedtime. Patient not taking: Reported on 06/06/2018 07/26/15   Gilda Crease, MD  omeprazole (PRILOSEC) 20 MG capsule Take 1 capsule (20 mg total) by mouth daily. Patient not taking: Reported on 06/06/2018 04/10/17   Azalia Bilis, MD    Family History History reviewed. No pertinent family history.  Social History Social History   Tobacco Use  . Smoking status: Current Every Day Smoker    Packs/day: 1.00  . Smokeless tobacco: Never Used  Substance Use Topics  . Alcohol use: No  . Drug use: No     Allergies   Patient has no known allergies.   Review of Systems Review of Systems  Unable to perform ROS: Psychiatric disorder  Psychiatric/Behavioral: Positive for behavioral problems and hallucinations.  All other systems reviewed and are negative.    Physical Exam Updated Vital Signs BP (!) 136/95   Pulse (!) 57   Temp 98.1 F (36.7 C) (Oral)   Resp 18   SpO2 100%   Physical Exam  Constitutional: He appears well-developed and well-nourished.  NAD.  HENT:  Head: Normocephalic and atraumatic.  Right Ear: External ear normal.  Left Ear: External ear normal.  Nose: Nose normal.  No facial or scalp tenderness. MMM. No intraoral bleeding.   Eyes: Conjunctivae and EOM are normal.  Neck: Normal range  of motion. Neck supple.  Cardiovascular: Normal rate, regular rhythm and normal heart sounds.  Pulses:      Radial pulses are 2+ on the right side, and 2+ on the left side.  Pulmonary/Chest: Effort normal and breath sounds normal.  Abdominal: Soft. There is no tenderness.  Musculoskeletal: Normal range of motion.  Neurological: He is alert. He is disoriented.  Speech is fluent without obvious dysarthria or dysphasia. Strength 5/5 with hand grip and ankle F/E.   Sensation to light touch intact in hands and feet. No truncal sway.No leg drop.  Normal finger-to-nose and finger tapping.    CN I and VIII not tested. CN II-XII grossly intact bilaterally.   Skin: Skin is warm and dry. Capillary refill takes less than 2 seconds.  Psychiatric: His affect is labile. His speech is delayed. He is agitated and actively hallucinating. Thought content is delusional. Cognition and memory are impaired. He expresses inappropriate judgment.  Fast eye movements, looking at his hands/arm, around room.  Delayed responses, need repeated questioning.  Tries to be cooperative, appears to be distracted by looking at this skin/hands. No SI, HI.  Active tactile hallucinations.   Nursing note and vitals reviewed.    ED Treatments / Results  Labs (all labs ordered are listed, but only abnormal results are displayed) Labs Reviewed  COMPREHENSIVE METABOLIC PANEL - Abnormal; Notable for the following components:      Result Value   Sodium 146 (*)    Total Bilirubin 2.0 (*)    All other components within normal limits  ACETAMINOPHEN LEVEL - Abnormal; Notable for the following components:   Acetaminophen (Tylenol), Serum <10 (*)    All other components within normal limits  URINALYSIS, ROUTINE W REFLEX MICROSCOPIC - Abnormal; Notable for the following components:   Color, Urine AMBER (*)    APPearance HAZY (*)    Specific Gravity, Urine 1.033 (*)    Ketones, ur 5 (*)    Protein, ur 30 (*)    Bacteria, UA MANY (*)    All other components within normal limits  RAPID URINE DRUG SCREEN, HOSP PERFORMED - Abnormal; Notable for the following components:   Amphetamines POSITIVE (*)    All other components within normal limits  ETHANOL  CBC WITH DIFFERENTIAL/PLATELET  SALICYLATE LEVEL    EKG None  Radiology No results found.  Procedures Procedures (including critical care time)  Medications Ordered in ED Medications - No data to display   Initial Impression / Assessment and Plan / ED Course  I have reviewed the triage vital signs and the nursing notes.  Pertinent labs & imaging results  that were available during my care of the patient were reviewed by me and considered in my medical decision making (see chart for details).      Patient brought in by police for bizarre behavior.  Documented history of schizophrenia, erratic behavior, hallucinations.  He denies this.  He is not on any psych medications.  Known recent stressors at home, separation, homelessness.  Denies illicit drug use however positive for amphetamines and urine.  Denies EtOH abuse.  On exam he is labile, actively having visual hallucinations, distracted.  He denies SI or HI.  He has needed admission for psych treatment for similar presentations in the past.  Labs reviewed and WNL.  He is medically cleared for psych evaluation.  No medications at home to order.  Discussed work-up so far with police officer at bedside, pending TTS recommendations.  I asked patient for family  contact information but he cannot give me the phone number of his wife.  1750: Per TTS note, pt does not meet criteria for inpatient tx.  He is medically cleared.  Will be discharged.  Final Clinical Impressions(s) / ED Diagnoses   Final diagnoses:  Hallucinations  Bizarre behavior  Homelessness    ED Discharge Orders    None       Jerrell Mylar 06/06/18 Elberta Spaniel, MD 06/07/18 1538

## 2018-06-06 NOTE — ED Notes (Signed)
Pt aware of need for urine specimen. Pt has urinal at bedside.

## 2018-06-06 NOTE — BH Assessment (Signed)
Assessment Note  Miguel Contreras is a 48 y.o. male who presented to [facility] by police for bizarre behavior.  Pt is under arrest of [employer] due to probation violation.  Per report, Pt was acting in bizarre manner outside of a [employer] store -- gesticulating, punching at the air.  Pt was last assessed by TTS in December 2016 -- at that time, Pt exhibited psychotic behavior.  Pt lives in [place].  Per report, he and his wife recently separated, as she kicked him out of the hotel where the couple and their child live.  Per report, employees at a local [employer] called police after observing Pt wandering in and out of the store, gesticulating, and punching at the air.  Police arrived and determined that Pt was in violation of probation.  Pt was arrested.  Prior to transporting to jail, [employer] performed a basic MSE.  Pt was not oriented.  For this reason, Pt was transported to the hospital.  Pt reported that he came to the hospital to be checked out.  Pt denied bizarre behavior. He said he felt excited and was shaking his head and pointing.  ''I only did it for a little bit.''  He denied suicidal ideation, homicidal ideation, hallucination, self-injurious behavior and substance use.  UDS was positive for amphetamines.  Pt endorsed feeling despondent over unemployment.  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  He denied any current psychiatric treatment.  Consulted with Irving Burton, NP, who determined that Pt does not meet inpatient criteria.  Diagnosis: Amphetamine Use Disorder  Past Medical History:  Past Medical History:  Diagnosis Date  . Depression   . Hallucinations   . Helicobacter positive gastritis   . Hypertension     History reviewed. No pertinent surgical history.  Family History: History reviewed. No pertinent family history.  Social History:  reports that he has been smoking. He has been smoking about 1.00 pack per day. He has never used  smokeless tobacco. He reports that he does not drink alcohol or use drugs.  Additional Social History:  Alcohol / Drug Use Pain Medications: See MAR Prescriptions: See MAR Over the Counter: See MAR  CIWA: CIWA-Ar BP: 118/74 Pulse Rate: 83 COWS:    Allergies: No Known Allergies  Home Medications:  (Not in a hospital admission)  OB/GYN Status:  No LMP for male patient.  General Assessment Data Location of Assessment: [facility] ED TTS Assessment: In system Is this a Tele or Face-to-Face Assessment?: Face-to-Face Is this an Initial Assessment or a Re-assessment for this encounter?: Initial Assessment Patient Accompanied by:: N/A Language Other than English: Yes(Pt spoke [place]) What is your preferred language: Spanish Living Arrangements: Homeless/Shelter What gender do you identify as?: Male Marital status: Married Pregnancy Status: No Living Arrangements: Other (Comment) Can pt return to current living arrangement?: No(Pt is currently under arrest) Admission Status: Other (Comment)(Pt is under arrest) Is patient capable of signing voluntary admission?: Yes Referral Source: Other(Police) Insurance type: None     Crisis Care Plan Living Arrangements: Other (Comment) Name of Psychiatrist: None Name of Therapist: None  Education Status Is patient currently in school?: No Is the patient employed, unemployed or receiving disability?: Unemployed  Risk to self with the past 6 months Suicidal Ideation: No Has patient been a risk to self within the past 6 months prior to admission? : No Suicidal Intent: No Has patient had any suicidal intent within the past 6 months prior  to admission? : No Is patient at risk for suicide?: No Suicidal Plan?: No Has patient had any suicidal plan within the past 6 months prior to admission? : No Access to Means: No Previous Attempts/Gestures: No Intentional Self Injurious Behavior: None Family Suicide History: No Recent stressful life  event(s): Conflict (Comment)(Conflict with wife) Persecutory voices/beliefs?: No Depression: Yes Depression Symptoms: Feeling angry/irritable, Despondent(Sad over unemployment) Substance abuse history and/or treatment for substance abuse?: No Suicide prevention information given to non-admitted patients: Not applicable  Risk to Others within the past 6 months Homicidal Ideation: No Does patient have any lifetime risk of violence toward others beyond the six months prior to admission? : No Thoughts of Harm to Others: No Current Homicidal Intent: No Current Homicidal Plan: No Access to Homicidal Means: No History of harm to others?: No Assessment of Violence: None Noted Does patient have access to weapons?: No Criminal Charges Pending?: No Does patient have a court date: No Is patient on probation?: Yes(On probation for [employer] vending machines; dwl)  Psychosis Hallucinations: None noted(Pt denied) Delusions: None noted(None indicated)  Mental Status Report Appearance/Hygiene: Disheveled Eye Contact: Fair Motor Activity: Freedom of movement, Unremarkable Speech: Logical/coherent Level of Consciousness: Alert Mood: Euthymic Affect: Appropriate to circumstance Anxiety Level: None Thought Processes: Coherent, Relevant Judgement: Partial Orientation: Person, Place, Time, Situation Obsessive Compulsive Thoughts/Behaviors: None  Cognitive Functioning Concentration: Normal Memory: Remote Intact, Recent Intact Is patient IDD: No Insight: Fair Impulse Control: Fair Appetite: Fair Have you had any weight changes? : No Change Sleep: No Change Total Hours of Sleep: 7 Vegetative Symptoms: None  ADLScreening [facility]) Patient's cognitive ability adequate to safely complete daily activities?: Yes Patient able to express need for assistance with ADLs?: Yes Independently performs ADLs?: Yes (appropriate for developmental age)  Prior Inpatient Therapy Prior Inpatient  Therapy: Yes Prior Therapy Dates: 2016 Reason for Treatment: Psychosis  Prior Outpatient Therapy Prior Outpatient Therapy: No Does patient have an ACCT team?: No Does patient have Intensive In-House Services?  : No Does patient have [facility] services? : No Does patient have P4CC services?: No  ADL Screening (condition at time of admission) Patient's cognitive ability adequate to safely complete daily activities?: Yes Is the patient deaf or have difficulty hearing?: No Does the patient have difficulty seeing, even when wearing glasses/contacts?: No Does the patient have difficulty concentrating, remembering, or making decisions?: No Patient able to express need for assistance with ADLs?: Yes Does the patient have difficulty dressing or bathing?: No Independently performs ADLs?: Yes (appropriate for developmental age) Does the patient have difficulty walking or climbing stairs?: No Weakness of Legs: None Weakness of Arms/Hands: None  Home Assistive Devices/Equipment Home Assistive Devices/Equipment: None  Therapy Consults (therapy consults require a physician order) PT Evaluation Needed: No OT Evalulation Needed: No SLP Evaluation Needed: No Abuse/Neglect Assessment (Assessment to be complete while patient is alone) Abuse/Neglect Assessment Can Be Completed: Unable to assess, patient is non-responsive or altered mental status Values / Beliefs Cultural Requests During Hospitalization: None Spiritual Requests During Hospitalization: None Consults Spiritual Care Consult Needed: No Social Work Consult Needed: No Merchant navy officer (For Healthcare) Does Patient Have a Medical Advance Directive?: No          Disposition:  Disposition Initial Assessment Completed for this Encounter: Yes Disposition of Patient: Discharge(Pt psych-cleared)  On Site Evaluation by:   Reviewed with Physician:    Dorris Fetch Empress Newmann 06/06/2018 4:28 PM

## 2018-06-21 ENCOUNTER — Encounter (HOSPITAL_BASED_OUTPATIENT_CLINIC_OR_DEPARTMENT_OTHER): Payer: Self-pay | Admitting: Emergency Medicine

## 2018-06-21 ENCOUNTER — Other Ambulatory Visit: Payer: Self-pay

## 2018-06-21 ENCOUNTER — Emergency Department (HOSPITAL_BASED_OUTPATIENT_CLINIC_OR_DEPARTMENT_OTHER)
Admission: EM | Admit: 2018-06-21 | Discharge: 2018-06-21 | Disposition: A | Discharge status: Home / Self Care | Payer: Self-pay | Attending: Emergency Medicine | Admitting: Emergency Medicine

## 2018-06-21 DIAGNOSIS — Z79899 Other long term (current) drug therapy: Secondary | ICD-10-CM | POA: Insufficient documentation

## 2018-06-21 DIAGNOSIS — I10 Essential (primary) hypertension: Secondary | ICD-10-CM | POA: Insufficient documentation

## 2018-06-21 DIAGNOSIS — T59893A Toxic effect of other specified gases, fumes and vapors, assault, initial encounter: Secondary | ICD-10-CM | POA: Insufficient documentation

## 2018-06-21 DIAGNOSIS — H10213 Acute toxic conjunctivitis, bilateral: Secondary | ICD-10-CM | POA: Insufficient documentation

## 2018-06-21 DIAGNOSIS — F17210 Nicotine dependence, cigarettes, uncomplicated: Secondary | ICD-10-CM | POA: Insufficient documentation

## 2018-06-21 DIAGNOSIS — Y939 Activity, unspecified: Secondary | ICD-10-CM | POA: Insufficient documentation

## 2018-06-21 DIAGNOSIS — Y929 Unspecified place or not applicable: Secondary | ICD-10-CM | POA: Insufficient documentation

## 2018-06-21 DIAGNOSIS — Y999 Unspecified external cause status: Secondary | ICD-10-CM | POA: Insufficient documentation

## 2018-06-21 LAB — COMPREHENSIVE METABOLIC PANEL
ALBUMIN: 4 g/dL (ref 3.5–5.0)
ALK PHOS: 105 U/L (ref 38–126)
ALT: 21 U/L (ref 0–44)
AST: 21 U/L (ref 15–41)
Anion gap: 7 (ref 5–15)
BUN: 18 mg/dL (ref 6–20)
CHLORIDE: 107 mmol/L (ref 98–111)
CO2: 23 mmol/L (ref 22–32)
CREATININE: 0.99 mg/dL (ref 0.61–1.24)
Calcium: 8.4 mg/dL — ABNORMAL LOW (ref 8.9–10.3)
GFR calc Af Amer: >60 mL/min (ref >60)
GFR calc non Af Amer: >60 mL/min (ref >60)
Glucose, Bld: 117 mg/dL — ABNORMAL HIGH (ref 70–99)
Potassium: 3.9 mmol/L (ref 3.5–5.1)
SODIUM: 137 mmol/L (ref 135–145)
Total Bilirubin: 0.8 mg/dL (ref 0.3–1.2)
Total Protein: 6.6 g/dL (ref 6.5–8.1)

## 2018-06-21 LAB — CBC WITH DIFFERENTIAL/PLATELET
BASOS ABS: 0 10*3/uL (ref 0.0–0.1)
BASOS PCT: 0 %
EOS ABS: 0.1 10*3/uL (ref 0.0–0.7)
Eosinophils Relative: 2 %
HCT: 43.2 % (ref 39.0–52.0)
Hemoglobin: 15 g/dL (ref 13.0–17.0)
LYMPHS ABS: 1 10*3/uL (ref 0.7–4.0)
Lymphocytes Relative: 14 %
MCH: 30.4 pg (ref 26.0–34.0)
MCHC: 34.7 g/dL (ref 30.0–36.0)
MCV: 87.4 fL (ref 78.0–100.0)
Monocytes Absolute: 0.4 10*3/uL (ref 0.1–1.0)
Monocytes Relative: 5 %
NEUTROS PCT: 79 %
Neutro Abs: 5.5 10*3/uL (ref 1.7–7.7)
PLATELETS: 187 10*3/uL (ref 150–400)
RBC: 4.94 MIL/uL (ref 4.22–5.81)
RDW: 13 % (ref 11.5–15.5)
WBC: 6.9 10*3/uL (ref 4.0–10.5)

## 2018-06-21 MED ORDER — TETRACAINE HCL 0.5 % OP SOLN
OPHTHALMIC | Status: AC
  Filled 2018-06-21: qty 4

## 2018-06-21 MED ORDER — TETRACAINE HCL 0.5 % OP SOLN: 2.0000 [drp] | Freq: Once | OPHTHALMIC | Status: DC

## 2018-06-21 MED ORDER — FLUORESCEIN SODIUM 1 MG OP STRP: 1.0000 | ORAL_STRIP | Freq: Once | OPHTHALMIC | Status: DC

## 2018-06-21 MED ORDER — FLUORESCEIN SODIUM 1 MG OP STRP
ORAL_STRIP | OPHTHALMIC | Status: AC
  Filled 2018-06-21: qty 2

## 2018-06-21 NOTE — ED Notes (Signed)
Numerous calls made to find pt housing for the night. Pt states he has a friend in [employer]. Pt given cab voucher to [place] on [address]. Where pt states he can walk to friends house from there. Pt agreeable to this plan.

## 2018-06-21 NOTE — Discharge Instructions (Addendum)
Wash well and change clothes.  You do not appear to have an eye injury.  Drink plenty of fluids due to recent diarrhea.
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
## 2019-01-30 IMAGING — CR DG ABDOMEN 2V
3 series · 3 of 3 positions shown · non-contrast
Comparison: Abdominal radiograph dated 01/07/2017

CLINICAL DATA: 46-year-old male with abdominal pain.

EXAM:
ABDOMEN - 2 VIEW

[w abdomen upright]
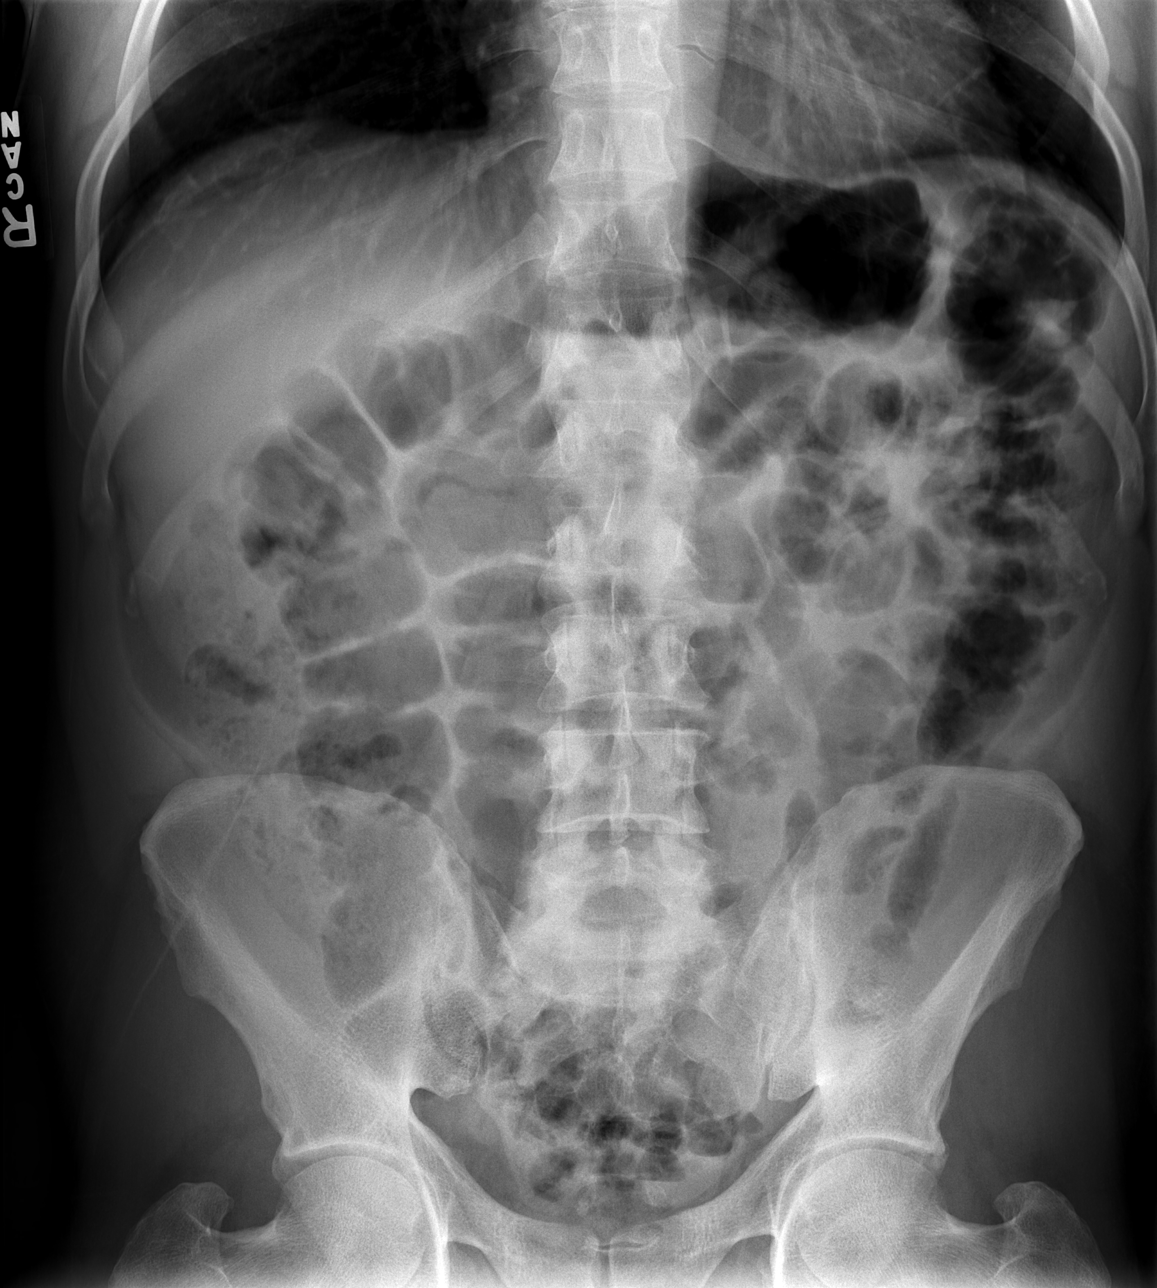

[t abdomen supine (1 of 2)]
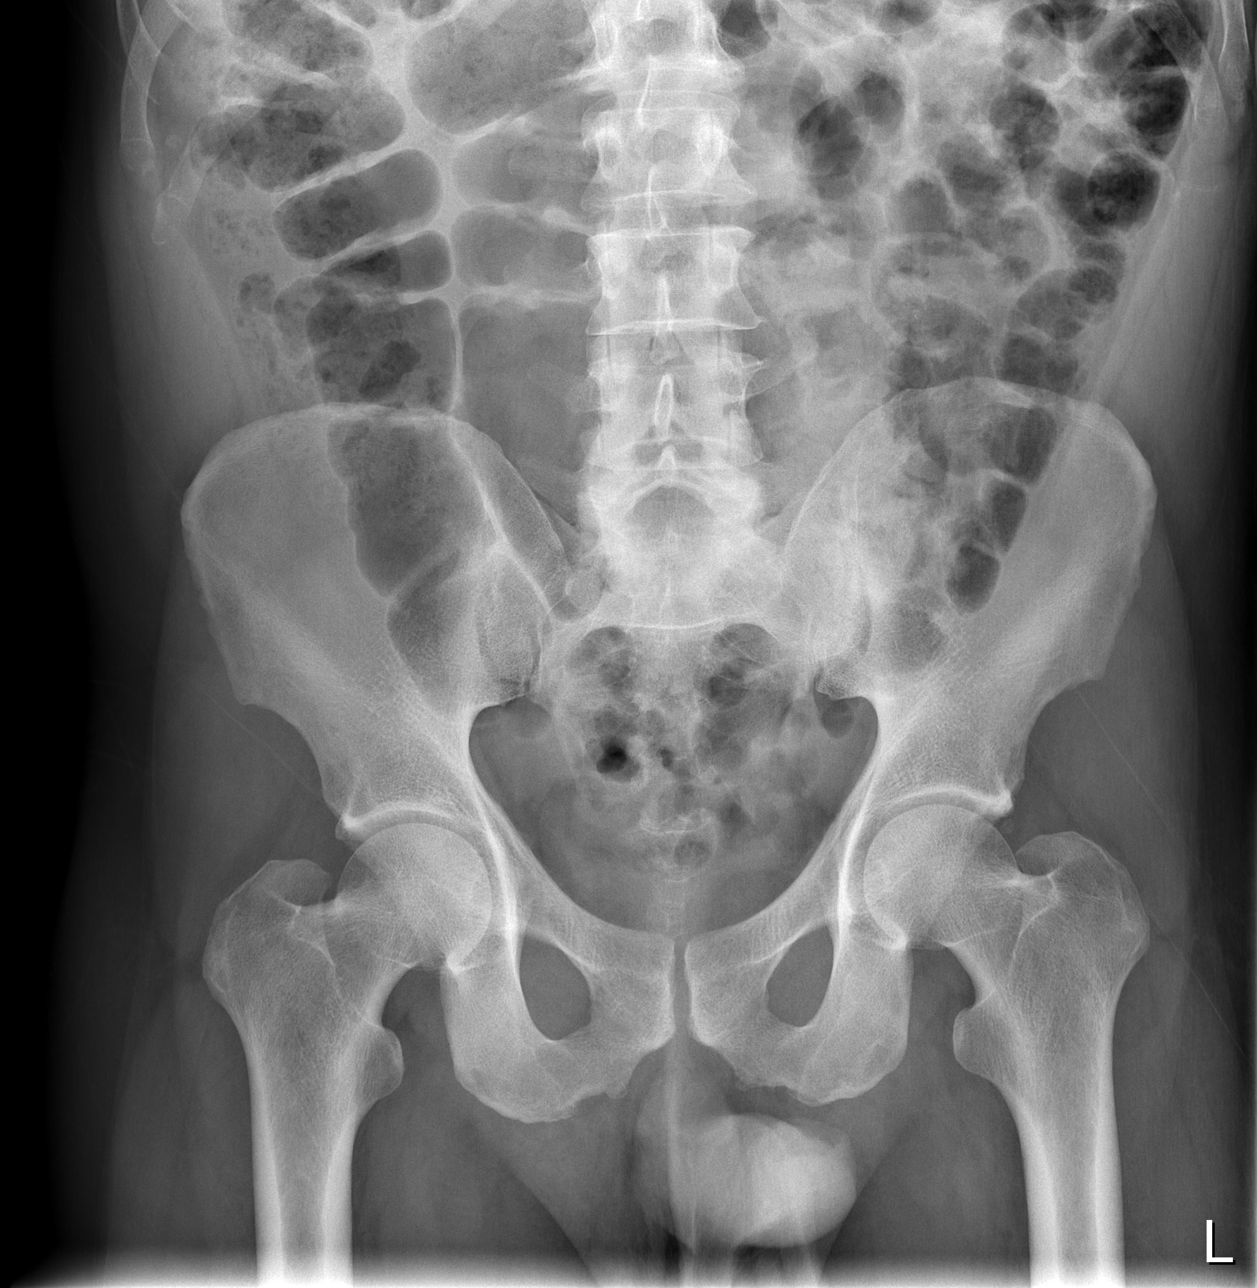

[t abdomen supine (2 of 2)]
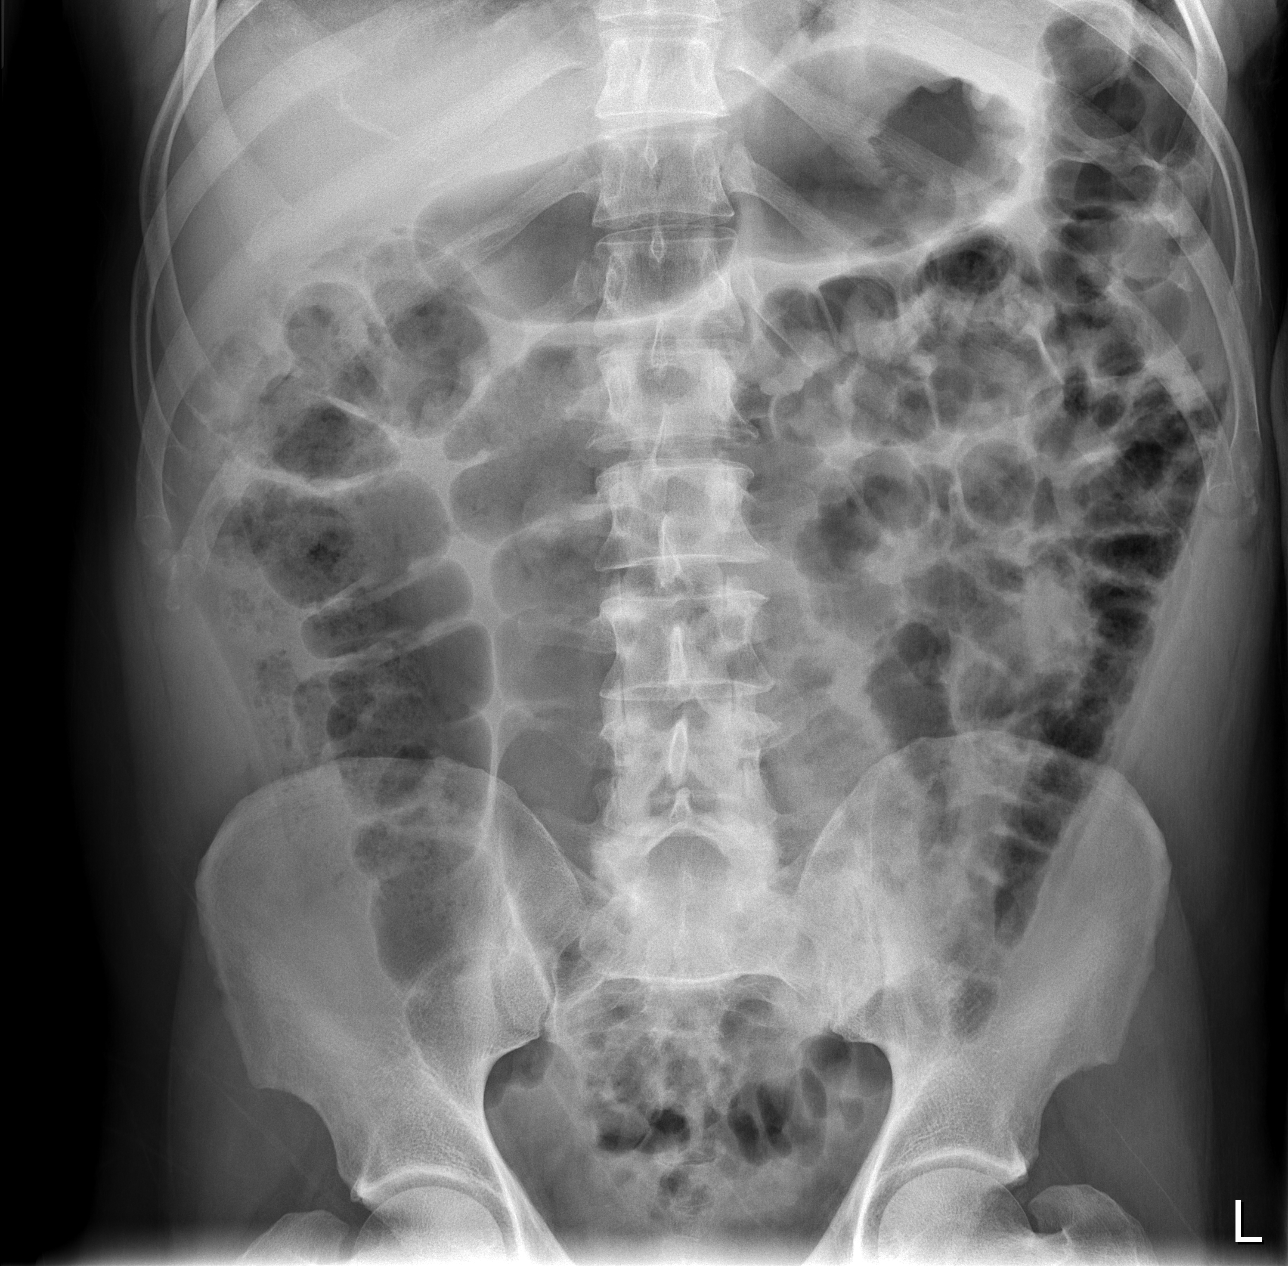

[3 of 3 positions shown; findings below may reference images not displayed]

FINDINGS: The bowel gas pattern is normal. There is no evidence of free air.
No radio-opaque calculi or other significant radiographic
abnormality is seen.
IMPRESSION: Negative.

## 2024-07-10 ENCOUNTER — Telehealth: Payer: Self-pay

## 2024-07-10 ENCOUNTER — Encounter: Payer: Self-pay | Admitting: Emergency Medicine

## 2024-07-10 ENCOUNTER — Ambulatory Visit
Admission: EM | Admit: 2024-07-10 | Discharge: 2024-07-10 | Disposition: A | Discharge status: Home / Self Care | Payer: Self-pay

## 2024-07-10 DIAGNOSIS — K4090 Unilateral inguinal hernia, without obstruction or gangrene, not specified as recurrent: Secondary | ICD-10-CM

## 2024-07-10 NOTE — ED Triage Notes (Signed)
 Pt c/o pain in right groin x's 2 weeks   Pt st's he thinks he has a small hernia

## 2024-07-10 NOTE — ED Provider Notes (Signed)
 [facility]    CSN: [id number] Arrival date & time: 07/10/24  1110      History   Chief Complaint Chief Complaint  Patient presents with   Groin Pain    HPI Miguel Contreras is a 53 y.o. male.   Pt presents today due to potential hernia for the past 2 weeks. 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 Pt states that he is experiencing 5/10 pain and denies use of medicine for pain. Pt states that he is eating and drinking a little less than normal, pt states that he is experiencing regular bowel movements, denies pain with bowel movements.   The history is provided by the patient.  Groin Pain    Past Medical History:  Diagnosis Date   Depression    Hallucinations    Helicobacter positive gastritis    Hypertension     There are no active problems to display for this patient.   History reviewed. No pertinent surgical history.     Home Medications    Prior to Admission medications   Medication Sig Start Date End Date Taking? Authorizing Provider  acetaminophen  (TYLENOL ) 500 MG tablet Take 1 tablet (500 mg total) by mouth every 6 (six) hours as needed. Patient not taking: Reported on 06/06/2018 04/10/17   Baxter Drivers, MD  amitriptyline  (ELAVIL ) 25 MG tablet Take 1 tablet (25 mg total) by mouth at bedtime. Patient not taking: Reported on 06/06/2018 07/26/15   Haze Lonni PARAS, MD  omeprazole  (PRILOSEC) 20 MG capsule Take 1 capsule (20 mg total) by mouth daily. Patient not taking: Reported on 06/06/2018 04/10/17   Baxter Drivers, MD    Family History No family history on file.  Social History Social History   Tobacco Use   Smoking status: Every Day    Current packs/day: 1.00    Types: Cigarettes   Smokeless tobacco: Never  Substance Use Topics   Alcohol use: Yes    Comment: sometimes   Drug use: Yes    Types: Marijuana, Methamphetamines     Allergies   Patient has no known allergies.   Review of Systems Review of  Systems   Physical Exam Triage Vital Signs ED Triage Vitals [07/10/24 1131]  Encounter Vitals Group     BP (!) 158/100     Girls Systolic BP Percentile      Girls Diastolic BP Percentile      Boys Systolic BP Percentile      Boys Diastolic BP Percentile      Pulse Rate 87     Resp 20     Temp 98.1 F (36.7 C)     Temp Source Oral     SpO2 97 %     Weight      Height      Head Circumference      Peak Flow      Pain Score 5     Pain Loc      Pain Education      Exclude from Growth Chart    No data found.  Updated Vital Signs BP (!) 158/100 (BP Location: Left Arm)   Pulse 87   Temp 98.1 F (36.7 C) (Oral)   Resp 20   SpO2 97%   Visual Acuity Right Eye Distance:   Left Eye Distance:   Bilateral Distance:    Right Eye Near:   Left Eye Near:    Bilateral Near:     Physical Exam Vitals and nursing note  reviewed.  Constitutional:      General: He is not in acute distress.    Appearance: Normal appearance. He is not ill-appearing, toxic-appearing or diaphoretic.  Eyes:     General: No scleral icterus. Cardiovascular:     Rate and Rhythm: Normal rate and regular rhythm.     Heart sounds: Normal heart sounds.  Pulmonary:     Effort: Pulmonary effort is normal. No respiratory distress.     Breath sounds: Normal breath sounds. No wheezing or rhonchi.  Abdominal:     General: Abdomen is flat. Bowel sounds are normal.     Palpations: Abdomen is soft.     Tenderness: There is abdominal tenderness. There is no right CVA tenderness or left CVA tenderness.     Hernia: A hernia is present.      Comments: Tender hernia noted of right side mons pubis, when standing there is protrusion, no protrusion noted when in supine position.   Skin:    General: Skin is warm.  Neurological:     Mental Status: He is alert and oriented to person, place, and time.  Psychiatric:        Mood and Affect: Mood normal.        Behavior: Behavior normal.      UC Treatments / Results   Labs (all labs ordered are listed, but only abnormal results are displayed) Labs Reviewed - No data to display  EKG   Radiology No results found.  Procedures Procedures (including critical care time)  Medications Ordered in UC Medications - No data to display  Initial Impression / Assessment and Plan / UC Course  I have reviewed the triage vital signs and the nursing notes.  Pertinent labs & imaging results that were available during my care of the patient were reviewed by me and considered in my medical decision making (see chart for details).     Inguinal hernia-patient has language barrier and presents with father in law, advised patient to report to ER for imaging and further evaluation of hernia.  Final Clinical Impressions(s) / UC Diagnoses   Final diagnoses:  Unilateral inguinal hernia without obstruction or gangrene, recurrence not specified     Discharge Instructions      Please do not lift anything more than 15 lbs until you are fully evaluated  Use stool softener daily in order to prevent straining during bowel movements.     ED Prescriptions   None    PDMP not reviewed this encounter.   Miguel Corean BROCKS, PA-C 07/10/24 1209

## 2024-07-10 NOTE — Discharge Instructions (Addendum)
 Please do not lift anything more than 15 lbs until you are fully evaluated  Use stool softener daily in order to prevent straining during bowel movements.

## 2024-07-10 NOTE — Telephone Encounter (Signed)
 Telephone encounter created to send referral to [facility] for further evaluation of hernia
